# Patient Record
Sex: Female | Born: 1950 | Race: White | Hispanic: No | Smoking: Current every day smoker
Health system: Southern US, Community
[De-identification: ages and names within clinical notes are randomized; demographics above are authoritative.]

## PROBLEM LIST (undated history)

## (undated) DIAGNOSIS — K219 Gastro-esophageal reflux disease without esophagitis: Secondary | ICD-10-CM

## (undated) DIAGNOSIS — M109 Gout, unspecified: Secondary | ICD-10-CM

## (undated) DIAGNOSIS — F32A Depression, unspecified: Secondary | ICD-10-CM

## (undated) DIAGNOSIS — I10 Essential (primary) hypertension: Secondary | ICD-10-CM

## (undated) DIAGNOSIS — E785 Hyperlipidemia, unspecified: Secondary | ICD-10-CM

## (undated) DIAGNOSIS — J45909 Unspecified asthma, uncomplicated: Secondary | ICD-10-CM

## (undated) DIAGNOSIS — E669 Obesity, unspecified: Secondary | ICD-10-CM

## (undated) DIAGNOSIS — F329 Major depressive disorder, single episode, unspecified: Secondary | ICD-10-CM

## (undated) DIAGNOSIS — I509 Heart failure, unspecified: Secondary | ICD-10-CM

## (undated) DIAGNOSIS — J449 Chronic obstructive pulmonary disease, unspecified: Secondary | ICD-10-CM

## (undated) DIAGNOSIS — I719 Aortic aneurysm of unspecified site, without rupture: Secondary | ICD-10-CM

## (undated) DIAGNOSIS — R0789 Other chest pain: Secondary | ICD-10-CM

## (undated) DIAGNOSIS — F419 Anxiety disorder, unspecified: Secondary | ICD-10-CM

---

## 2010-11-07 ENCOUNTER — Emergency Department (HOSPITAL_COMMUNITY)
Admission: EM | Admit: 2010-11-07 | Discharge: 2010-11-07 | Payer: Self-pay | Source: Home / Self Care | Admitting: Emergency Medicine

## 2011-10-22 ENCOUNTER — Other Ambulatory Visit (HOSPITAL_COMMUNITY): Payer: Self-pay | Admitting: Internal Medicine

## 2011-10-22 DIAGNOSIS — R911 Solitary pulmonary nodule: Secondary | ICD-10-CM

## 2011-10-22 DIAGNOSIS — I2699 Other pulmonary embolism without acute cor pulmonale: Secondary | ICD-10-CM

## 2011-10-27 ENCOUNTER — Encounter (HOSPITAL_COMMUNITY)
Admission: RE | Admit: 2011-10-27 | Discharge: 2011-10-27 | Disposition: A | Discharge status: Home / Self Care | Payer: Self-pay | Source: Ambulatory Visit | Attending: Internal Medicine | Admitting: Internal Medicine

## 2011-10-27 DIAGNOSIS — I2510 Atherosclerotic heart disease of native coronary artery without angina pectoris: Secondary | ICD-10-CM | POA: Insufficient documentation

## 2011-10-27 DIAGNOSIS — R918 Other nonspecific abnormal finding of lung field: Secondary | ICD-10-CM | POA: Insufficient documentation

## 2011-10-27 DIAGNOSIS — I2699 Other pulmonary embolism without acute cor pulmonale: Secondary | ICD-10-CM

## 2011-10-27 DIAGNOSIS — R911 Solitary pulmonary nodule: Secondary | ICD-10-CM

## 2011-10-27 DIAGNOSIS — I700 Atherosclerosis of aorta: Secondary | ICD-10-CM | POA: Insufficient documentation

## 2011-10-27 DIAGNOSIS — K439 Ventral hernia without obstruction or gangrene: Secondary | ICD-10-CM | POA: Insufficient documentation

## 2011-10-27 DIAGNOSIS — D3500 Benign neoplasm of unspecified adrenal gland: Secondary | ICD-10-CM | POA: Insufficient documentation

## 2011-10-27 LAB — GLUCOSE, CAPILLARY: Glucose-Capillary: 120 mg/dL — ABNORMAL HIGH (ref 70–99)

## 2011-10-27 MED ORDER — FLUDEOXYGLUCOSE F - 18 (FDG) INJECTION
19.0000 | Freq: Once | INTRAVENOUS | Status: AC | PRN
  Administered 2011-10-27: 19 via INTRAVENOUS

## 2012-03-08 DIAGNOSIS — R079 Chest pain, unspecified: Secondary | ICD-10-CM

## 2012-03-12 ENCOUNTER — Emergency Department (HOSPITAL_COMMUNITY)
Admission: EM | Admit: 2012-03-12 | Discharge: 2012-03-12 | Disposition: A | Discharge status: Home / Self Care | Payer: Medicare Other | Attending: Emergency Medicine | Admitting: Emergency Medicine

## 2012-03-12 ENCOUNTER — Emergency Department (HOSPITAL_COMMUNITY): Payer: Medicare Other

## 2012-03-12 ENCOUNTER — Encounter (HOSPITAL_COMMUNITY): Payer: Self-pay

## 2012-03-12 ENCOUNTER — Encounter (HOSPITAL_COMMUNITY): Payer: Self-pay | Admitting: *Deleted

## 2012-03-12 ENCOUNTER — Emergency Department (INDEPENDENT_AMBULATORY_CARE_PROVIDER_SITE_OTHER)
Admission: EM | Admit: 2012-03-12 | Discharge: 2012-03-12 | Disposition: A | Discharge status: Other Acute Inpatient Hospital | Payer: Medicare Other | Source: Home / Self Care | Attending: Family Medicine | Admitting: Family Medicine

## 2012-03-12 DIAGNOSIS — I509 Heart failure, unspecified: Secondary | ICD-10-CM | POA: Insufficient documentation

## 2012-03-12 DIAGNOSIS — K219 Gastro-esophageal reflux disease without esophagitis: Secondary | ICD-10-CM | POA: Insufficient documentation

## 2012-03-12 DIAGNOSIS — I10 Essential (primary) hypertension: Secondary | ICD-10-CM | POA: Insufficient documentation

## 2012-03-12 DIAGNOSIS — R079 Chest pain, unspecified: Secondary | ICD-10-CM

## 2012-03-12 DIAGNOSIS — J449 Chronic obstructive pulmonary disease, unspecified: Secondary | ICD-10-CM | POA: Insufficient documentation

## 2012-03-12 DIAGNOSIS — I77819 Aortic ectasia, unspecified site: Secondary | ICD-10-CM | POA: Insufficient documentation

## 2012-03-12 DIAGNOSIS — J4489 Other specified chronic obstructive pulmonary disease: Secondary | ICD-10-CM | POA: Insufficient documentation

## 2012-03-12 DIAGNOSIS — F17200 Nicotine dependence, unspecified, uncomplicated: Secondary | ICD-10-CM | POA: Insufficient documentation

## 2012-03-12 HISTORY — DX: Heart failure, unspecified: I50.9

## 2012-03-12 HISTORY — DX: Chronic obstructive pulmonary disease, unspecified: J44.9

## 2012-03-12 HISTORY — DX: Aortic aneurysm of unspecified site, without rupture: I71.9

## 2012-03-12 HISTORY — DX: Gastro-esophageal reflux disease without esophagitis: K21.9

## 2012-03-12 HISTORY — DX: Essential (primary) hypertension: I10

## 2012-03-12 LAB — COMPREHENSIVE METABOLIC PANEL
Alkaline Phosphatase: 52 U/L (ref 39–117)
BUN: 12 mg/dL (ref 6–23)
CO2: 26 mEq/L (ref 19–32)
Chloride: 100 mEq/L (ref 96–112)
GFR calc Af Amer: 81 mL/min — ABNORMAL LOW (ref >90)
GFR calc non Af Amer: 70 mL/min — ABNORMAL LOW (ref >90)
Glucose, Bld: 112 mg/dL — ABNORMAL HIGH (ref 70–99)
Potassium: 3.2 mEq/L — ABNORMAL LOW (ref 3.5–5.1)
Total Bilirubin: 0.2 mg/dL — ABNORMAL LOW (ref 0.3–1.2)

## 2012-03-12 LAB — POCT I-STAT, CHEM 8
BUN: 13 mg/dL (ref 6–23)
Creatinine, Ser: 1 mg/dL (ref 0.50–1.10)
Glucose, Bld: 104 mg/dL — ABNORMAL HIGH (ref 70–99)
Potassium: 3.2 mEq/L — ABNORMAL LOW (ref 3.5–5.1)
Sodium: 139 mEq/L (ref 135–145)

## 2012-03-12 LAB — DIFFERENTIAL
Eosinophils Absolute: 0.2 10*3/uL (ref 0.0–0.7)
Lymphs Abs: 4.7 10*3/uL — ABNORMAL HIGH (ref 0.7–4.0)
Monocytes Absolute: 0.9 10*3/uL (ref 0.1–1.0)
Monocytes Relative: 7 % (ref 3–12)
Neutro Abs: 7.6 10*3/uL (ref 1.7–7.7)
Neutrophils Relative %: 57 % (ref 43–77)

## 2012-03-12 LAB — CBC
HCT: 42.4 % (ref 36.0–46.0)
HCT: 40.2 % (ref 36.0–46.0)
Hemoglobin: 15.1 g/dL — ABNORMAL HIGH (ref 12.0–15.0)
Hemoglobin: 14.2 g/dL (ref 12.0–15.0)
MCH: 30 pg (ref 26.0–34.0)
MCHC: 35.6 g/dL (ref 30.0–36.0)
MCV: 83.9 fL (ref 78.0–100.0)
RBC: 5.04 MIL/uL (ref 3.87–5.11)
WBC: 12 10*3/uL — ABNORMAL HIGH (ref 4.0–10.5)

## 2012-03-12 LAB — POCT I-STAT TROPONIN I

## 2012-03-12 LAB — LIPASE, BLOOD: Lipase: 49 U/L (ref 11–59)

## 2012-03-12 MED ORDER — SODIUM CHLORIDE 0.9 % IV BOLUS (SEPSIS)
1000.0000 mL | Freq: Once | INTRAVENOUS | Status: AC
  Administered 2012-03-12: 1000 mL via INTRAVENOUS

## 2012-03-12 MED ORDER — ONDANSETRON HCL 4 MG/2ML IJ SOLN
4.0000 mg | Freq: Once | INTRAMUSCULAR | Status: AC
  Administered 2012-03-12: 4 mg via INTRAVENOUS
  Filled 2012-03-12: qty 2

## 2012-03-12 MED ORDER — HYDROMORPHONE HCL PF 1 MG/ML IJ SOLN
1.0000 mg | Freq: Once | INTRAMUSCULAR | Status: AC
  Administered 2012-03-12: 1 mg via INTRAVENOUS
  Filled 2012-03-12: qty 1

## 2012-03-12 NOTE — ED Provider Notes (Signed)
History     CSN: [id number]  Arrival date & time 03/12/12  1744   First MD Initiated Contact with Patient 03/12/12 1751      Chief Complaint  Patient presents with  . Chest Pain    (Consider location/radiation/quality/duration/timing/severity/associated sxs/prior treatment) HPI Comments: 61 year old smoker female with history of COPD, hypertension and CHF. Recent hospital admission for chest pain discharge 2 days ago from [facility] in [place], [place] . Has not filled recent prescriptions including Levaquin. Diagnosed with a small thoraxic aneurysm as per patient report. Here complaining of persistent retrosternal burning sensation with no radiation, unchanged during the last week. Denies fever or chills, no SOB, no N/V/D. No abdominal pain. Has persistent cough with yellow sputum. No pleuritic type of pain. Denies wheezing.    Past Medical History  Diagnosis Date  . Aortic aneurysm     History reviewed. No pertinent past surgical history.  History reviewed. No pertinent family history.  History  Substance Use Topics  . Smoking status: Current Everyday Smoker  . Smokeless tobacco: Not on file  . Alcohol Use: No    OB History    Grav Para Term Preterm Abortions TAB SAB Ect Mult Living                  Review of Systems  Constitutional: Negative for fever, chills and diaphoresis.  Respiratory: Positive for cough. Negative for shortness of breath and wheezing.   Cardiovascular: Positive for chest pain and palpitations.  Neurological: Negative for dizziness and headaches.    Allergies  Penicillins  Home Medications   Current Outpatient Rx  Name Route Sig Dispense Refill  . ALBUTEROL SULFATE HFA 108 (90 BASE) MCG/ACT IN AERS Inhalation Inhale 2 puffs into the lungs every 6 (six) hours as needed.    . ALPRAZOLAM 0.5 MG PO TABS Oral Take 0.5 mg by mouth 3 (three) times daily as needed.    Marland Kitchen BENAZEPRIL-HYDROCHLOROTHIAZIDE 20-25 MG PO TABS Oral Take 1 tablet by mouth  daily.    Marland Kitchen CARVEDILOL 3.125 MG PO TABS Oral Take 3.125 mg by mouth 2 (two) times daily with a meal.    . FLUTICASONE-SALMETEROL 250-50 MCG/DOSE IN AEPB Inhalation Inhale 1 puff into the lungs every 12 (twelve) hours.    . FUROSEMIDE 40 MG PO TABS Oral Take 40 mg by mouth daily.    Marland Kitchen PANTOPRAZOLE SODIUM 40 MG PO TBEC Oral Take 40 mg by mouth daily.      BP 125/88  Pulse 79  Temp(Src) 98.2 F (36.8 C) (Oral)  Resp 10  SpO2 97%  Physical Exam  Nursing note and vitals reviewed. Constitutional: She is oriented to person, place, and time. She appears well-developed and well-nourished. No distress.       Perseverating about chest pain. Speaks in loud and full sentences.   HENT:  Mouth/Throat: Oropharynx is clear and moist.  Neck: No JVD present.  Cardiovascular: Normal rate, regular rhythm and normal heart sounds.        Trace bilateral LEE  Pulmonary/Chest:       Scattered rhonchi bilaterally. Impress rales in right lower base. No tachypnea or orthopnea. No active wheezing.   Abdominal: Soft. There is no tenderness.  Neurological: She is alert and oriented to person, place, and time.  Skin: No rash noted. She is not diaphoretic.    ED Course  Procedures (including critical care time)  Labs Reviewed - No data to display No results found.   1. Chest pain  MDM  61 year old smoker female with history of COPD, hypertension, CHF and thoraxic aneurism. Recent hospital admission for chest pain discharge 2 days ago from [facility] in [place], [place] . Here with persistent chest pain. On exam afebrile, non diaphoretic, stable vital signs, no JVD, impress right lower base crackles, and scattered rhonchi bilaterally, trace LEE. Not in respiratory distress.  EKG normal sinus rhythm ventricular rate 78, with no acute ischemic changes. Decided to transfer via shuttle to the emergency department for further evaluation and management.          Sharin Grave, MD 03/13/12  [phone]

## 2012-03-12 NOTE — ED Notes (Signed)
C/o CP (burning), heart beating out of her skin, palpitations/pounding. Sx ongoing for ~ 1 week. Comes and goes. Here from [facility], recently at [facility]. (Denies: nvd, fever, cough, sob, congestion, dizziness or other sx), alert, NAD, calm interactive, skin W&D, resps e/u.

## 2012-03-12 NOTE — ED Notes (Signed)
No rx given, pt voiced understanding to f/u with [place] and PCP.

## 2012-03-12 NOTE — ED Notes (Signed)
EKG given to DR. Jeraldine Loots

## 2012-03-12 NOTE — Discharge Instructions (Signed)
Your evaluation here tonight was largely reassuring, with no demonstration of acute changes in your medical condition.  It is extremely important that you followup with your primary care physician, as well as the cardiologists to whom you have been directly.  It is equally important that you stop smoking and obtain all prescribed medication as directed.   If you develop any new, or concerning changes in your condition please return to the emergency department immediately

## 2012-03-12 NOTE — ED Provider Notes (Signed)
History     CSN: [id number]  Arrival date & time 03/12/12  1857   First MD Initiated Contact with Patient 03/12/12 2106      Chief Complaint  Patient presents with  . Thoracic Aortic Aneurysm     sent from our urgent care   HPI The patient presents with concerns of ongoing chest pain.  She notes that she has had pain for approximately 2 weeks.  The pain is diffuse, tight.  She notes associated dyspnea, though no pleuritic or exertional changes in her pain.  The patient notes significant prostration and having been discharged from another facility 2 days ago following to consecutive presentations therefore this complaint.  Notably, the patient was told on discharge she has an aortic dissection, but she states that she was not made fully aware of what this means.  The patient was also provided prescriptions for medications, which she has not filled thus far. She notes that since discharge 2 days ago she continues to have this sense of discomfort I with no new characteristics, no new syncope, no new vomiting, diarrhea, fevers, chills.  She has a baseline mild cough, which is unchanged. Past Medical History  Diagnosis Date  . Aortic aneurysm   . COPD (chronic obstructive pulmonary disease)   . Hypertension   . CHF (congestive heart failure)   . Acid reflux     History reviewed. No pertinent past surgical history.  No family history on file.  History  Substance Use Topics  . Smoking status: Current Everyday Smoker  . Smokeless tobacco: Not on file  . Alcohol Use: No    OB History    Grav Para Term Preterm Abortions TAB SAB Ect Mult Living                  Review of Systems  Constitutional:       HPI  HENT:       HPI otherwise negative  Eyes: Negative.   Respiratory:       HPI, otherwise negative  Cardiovascular:       HPI, otherwise nmegative  Gastrointestinal: Negative for vomiting.  Genitourinary:       HPI, otherwise negative  Musculoskeletal:       HPI,  otherwise negative  Skin: Negative.   Neurological: Negative for syncope.    Allergies  Penicillins  Home Medications   Current Outpatient Rx  Name Route Sig Dispense Refill  . ALBUTEROL SULFATE HFA 108 (90 BASE) MCG/ACT IN AERS Inhalation Inhale 2 puffs into the lungs every 6 (six) hours as needed.    Marland Kitchen BENAZEPRIL-HYDROCHLOROTHIAZIDE 20-25 MG PO TABS Oral Take 1 tablet by mouth daily.    Marland Kitchen FLUTICASONE-SALMETEROL 250-50 MCG/DOSE IN AEPB Inhalation Inhale 1 puff into the lungs every 12 (twelve) hours.    . FUROSEMIDE 40 MG PO TABS Oral Take 40 mg by mouth daily.    . IPRATROPIUM BROMIDE 0.02 % IN SOLN Nebulization Take 500 mcg by nebulization 4 (four) times daily.    Marland Kitchen PANTOPRAZOLE SODIUM 40 MG PO TBEC Oral Take 40 mg by mouth daily.    Marland Kitchen TIOTROPIUM BROMIDE MONOHYDRATE 18 MCG IN CAPS Inhalation Place 18 mcg into inhaler and inhale daily.      BP 136/84  Pulse 75  Temp(Src) 98.2 F (36.8 C) (Oral)  Resp 16  SpO2 96%  Physical Exam  Nursing note and vitals reviewed. Constitutional: She is oriented to person, place, and time. She appears well-developed and well-nourished. No distress.  HENT:  Head: Normocephalic and atraumatic.  Eyes: Conjunctivae and EOM are normal.  Cardiovascular: Normal rate and regular rhythm.   Pulmonary/Chest: Effort normal. No stridor. No respiratory distress. She has decreased breath sounds. She has no wheezes. She has no rhonchi. She has no rales.  Abdominal: She exhibits no distension.  Musculoskeletal: She exhibits no edema.  Neurological: She is alert and oriented to person, place, and time. No cranial nerve deficit.  Skin: Skin is warm and dry.  Psychiatric: She has a normal mood and affect.    ED Course  Procedures (including critical care time)  Labs Reviewed  CBC - Abnormal; Notable for the following:    WBC 13.4 (*)    Hemoglobin 15.1 (*)    All other components within normal limits  DIFFERENTIAL - Abnormal; Notable for the following:     Lymphs Abs 4.7 (*)    All other components within normal limits  PRO B NATRIURETIC PEPTIDE - Abnormal; Notable for the following:    Pro B Natriuretic peptide (BNP) 271.6 (*)    All other components within normal limits  POCT I-STAT, CHEM 8 - Abnormal; Notable for the following:    Potassium 3.2 (*)    Glucose, Bld 104 (*)    Hemoglobin 15.6 (*)    All other components within normal limits  CBC - Abnormal; Notable for the following:    WBC 12.0 (*)    All other components within normal limits  COMPREHENSIVE METABOLIC PANEL - Abnormal; Notable for the following:    Potassium 3.2 (*)    Glucose, Bld 112 (*)    ALT 64 (*)    Total Bilirubin 0.2 (*)    GFR calc non Af Amer 70 (*)    GFR calc Af Amer 81 (*)    All other components within normal limits  POCT I-STAT TROPONIN I  LIPASE, BLOOD  TROPONIN I   Dg Chest 2 View  03/12/2012  *RADIOLOGY REPORT*  Clinical Data: Aortic aneurysm, tachycardia  CHEST - 2 VIEW  Comparison: 03/07/2012 radiograph, 03/05/2012 CT.  Findings: Cardiomediastinal contours are prominent however unchanged in this patient with known tortuous thoracic aorta with mild aneurysmal dilatation of the ascending component.  Prominent epicardial fat obscures the inferior margins of the heart. Allowing for this, no focal areas of consolidation identified.  No pleural effusion or pneumothorax.  Increased AP diameter may reflect COPD. The patient's known right lower lobe nodule is poorly visualized by radiographic technique.  Sequelae of prior lateral left rib fractures.  No acute osseous finding identified.  IMPRESSION: Cardiomediastinal contours are unchanged.  No radiographic evidence for acute cardiopulmonary process.  Original Report Authenticated By: Waneta Martins, M.D.     No diagnosis found.  Cardiac 75 sinus rhythm normal Pulse oximetry 97% room air normal  Date: 03/12/2012  Rate: 67  Rhythm: normal sinus rhythm  QRS Axis: normal  Intervals: normal  ST/T  Wave abnormalities: normal  Conduction Disutrbances: none  Narrative Interpretation: unremarkable    11:01 PM Following the patient's initial evaluation I obtained records from the patient's other hospital.  Those records are notable for the description of a CAT scan done 7 days ago that demonstrated a stable 4.7 cm dilation of the descending aorta, but no dissection.  The patient's discharge diagnoses from those hospitalizations for atypical chest pain, chronic bronchitis, hypertension, dyslipidemia, obesity, anxiety, depression, history of gout. A review of the patient's radiographic studies demonstrates no significant changes from prior studies.  MDM  This elderly appearing  smoker now presents with ongoing chest discomfort 2 days after being discharged following an evaluation that included CT angiography, stress test for this complaint.  On my exam the patient is in no distress, with unremarkable vital signs.  The patient seems most upset about the lack of clear diagnoses or explanation for her pain.  A review of the charts from the other facility and today's labs is reassuring for the absence of acute ongoing cardiac ischemia.  Specifically the patient's evaluation at the other facility included stress test and CTA.  These results were reassuring.  I discussed all findings with the patient, stressed the need to stop smoking and to obtain medications as prescribed.  She was discharged in stable condition to follow up with her physician.       Gerhard Munch, MD 03/12/12 [phone]

## 2012-03-12 NOTE — ED Notes (Signed)
Pt dx with thoracic aortic aneurysm at [facility] and was discharged recently.  Tonight, c/o "burning in chest" which she has had x 1 week. Denies n/v/d, SOB.

## 2012-03-12 NOTE — ED Notes (Addendum)
Patient was released 2 days ago from [facility] in [place] , [place], w diagnosis of thoracic aneurysm. Was given follow up appt and list of medications to have filled. States she has not filled any of her medications, as she does not have the funds to do so; c/o she is still having the same discomfort she was having when she went to the hospital, and wants to be check out; NAD at present C/o her chest is burning and her heart is beating out of her body

## 2012-05-12 ENCOUNTER — Encounter (HOSPITAL_COMMUNITY): Payer: Self-pay | Admitting: Emergency Medicine

## 2012-05-12 ENCOUNTER — Emergency Department (INDEPENDENT_AMBULATORY_CARE_PROVIDER_SITE_OTHER): Payer: Medicaid Other

## 2012-05-12 ENCOUNTER — Emergency Department (INDEPENDENT_AMBULATORY_CARE_PROVIDER_SITE_OTHER)
Admission: EM | Admit: 2012-05-12 | Discharge: 2012-05-12 | Disposition: A | Discharge status: Home / Self Care | Payer: Medicare Other | Source: Home / Self Care | Attending: Emergency Medicine | Admitting: Emergency Medicine

## 2012-05-12 DIAGNOSIS — J449 Chronic obstructive pulmonary disease, unspecified: Secondary | ICD-10-CM

## 2012-05-12 DIAGNOSIS — J4489 Other specified chronic obstructive pulmonary disease: Secondary | ICD-10-CM

## 2012-05-12 HISTORY — DX: Other chest pain: R07.89

## 2012-05-12 HISTORY — DX: Gout, unspecified: M10.9

## 2012-05-12 HISTORY — DX: Depression, unspecified: F32.A

## 2012-05-12 HISTORY — DX: Obesity, unspecified: E66.9

## 2012-05-12 HISTORY — DX: Major depressive disorder, single episode, unspecified: F32.9

## 2012-05-12 HISTORY — DX: Anxiety disorder, unspecified: F41.9

## 2012-05-12 HISTORY — DX: Unspecified asthma, uncomplicated: J45.909

## 2012-05-12 HISTORY — DX: Hyperlipidemia, unspecified: E78.5

## 2012-05-12 MED ORDER — GUAIFENESIN-CODEINE 100-10 MG/5ML PO SYRP: 5.0000 mL | ORAL_SOLUTION | Freq: Four times a day (QID) | ORAL | Status: AC | PRN

## 2012-05-12 MED ORDER — DEXAMETHASONE 4 MG PO TABS
16.0000 mg | ORAL_TABLET | Freq: Once | ORAL | Status: AC
  Administered 2012-05-12: 16 mg via ORAL

## 2012-05-12 MED ORDER — DEXAMETHASONE 4 MG PO TABS: ORAL_TABLET | ORAL | Status: AC

## 2012-05-12 MED ORDER — DEXAMETHASONE 4 MG PO TABS
ORAL_TABLET | ORAL | Status: AC
  Filled 2012-05-12: qty 4

## 2012-05-12 MED ORDER — ALBUTEROL SULFATE (5 MG/ML) 0.5% IN NEBU
INHALATION_SOLUTION | RESPIRATORY_TRACT | Status: AC
  Filled 2012-05-12: qty 1

## 2012-05-12 MED ORDER — ALBUTEROL SULFATE (5 MG/ML) 0.5% IN NEBU
5.0000 mg | INHALATION_SOLUTION | Freq: Once | RESPIRATORY_TRACT | Status: AC
  Administered 2012-05-12: 5 mg via RESPIRATORY_TRACT

## 2012-05-12 MED ORDER — IPRATROPIUM BROMIDE 0.02 % IN SOLN
0.5000 mg | Freq: Once | RESPIRATORY_TRACT | Status: AC
  Administered 2012-05-12: 0.5 mg via RESPIRATORY_TRACT

## 2012-05-12 MED ORDER — FLUTICASONE-SALMETEROL 250-50 MCG/DOSE IN AEPB: 1.0000 | INHALATION_SPRAY | Freq: Two times a day (BID) | RESPIRATORY_TRACT | Status: DC

## 2012-05-12 MED ORDER — AZITHROMYCIN 250 MG PO TABS: 250.0000 mg | ORAL_TABLET | Freq: Every day | ORAL | Status: AC

## 2012-05-12 NOTE — ED Provider Notes (Signed)
History     CSN: [id number]  Arrival date & time 05/12/12  1411   First MD Initiated Contact with Patient 05/12/12 1433      Chief Complaint  Patient presents with  . Bronchitis    (Consider location/radiation/quality/duration/timing/severity/associated sxs/prior treatment) HPI Comments: Patient is a long-term smoker with COPD/chronic bronchitis who presents with 4 days of increasing coughing which is productive of whitish sputum mixed with some thick yellowish sputum, wheezing, chest tightness, shortness of breath. No nausea, vomiting, fevers. No change in the color of her sputum. She was seen in the [place] ED 3 days ago, and she states that the labs and x-rays were negative for any acute infectious process. She was sent home with oral prednisone, which she did not fill. She is currently using her albuterol inhaler every 4 hours with temporary relief in her symptoms. She states she ran out of her Advair "months ago". She is not on any other inhaled steroids. No recent steroid use, admissions, intubations.  Patient is a 61 y.o. female presenting with cough. The history is provided by the patient. No language interpreter was used.  Cough This is a chronic problem. The current episode started more than 2 days ago. The problem occurs constantly. The problem has been gradually worsening. The cough is productive of sputum. There has been no fever. Associated symptoms include shortness of breath and wheezing. Pertinent negatives include no chest pain, no chills, no rhinorrhea, no sore throat and no myalgias. Treatments tried: albuterol q 4 hr. The treatment provided mild relief. She is a smoker. Her past medical history is significant for bronchitis, COPD and asthma. Her past medical history does not include pneumonia.    Past Medical History  Diagnosis Date  . Aortic aneurysm   . COPD (chronic obstructive pulmonary disease)   . Hypertension   . CHF (congestive heart failure)   . Acid reflux    . Chest pain, atypical     neg CTA and stress test 2013  . Obesity   . Asthma     chronic bronchitis  . Dyslipidemia   . Anxiety   . Depression   . Gout     History reviewed. No pertinent past surgical history.  History reviewed. No pertinent family history.  History  Substance Use Topics  . Smoking status: Current Everyday Smoker  . Smokeless tobacco: Not on file  . Alcohol Use: No    OB History    Grav Para Term Preterm Abortions TAB SAB Ect Mult Living                  Review of Systems  Constitutional: Negative for chills.  HENT: Negative for sore throat and rhinorrhea.   Respiratory: Positive for cough, shortness of breath and wheezing.   Cardiovascular: Negative for chest pain.  Musculoskeletal: Negative for myalgias.    Allergies  Penicillins  Home Medications   Current Outpatient Rx  Name Route Sig Dispense Refill  . ALBUTEROL SULFATE HFA 108 (90 BASE) MCG/ACT IN AERS Inhalation Inhale 2 puffs into the lungs every 6 (six) hours as needed.    . AZITHROMYCIN 250 MG PO TABS Oral Take 1 tablet (250 mg total) by mouth daily. 2 tabs po on day one, then one tablet po once daily on days 2-5. 6 tablet 0  . BENAZEPRIL-HYDROCHLOROTHIAZIDE 20-25 MG PO TABS Oral Take 1 tablet by mouth daily.    Marland Kitchen DEXAMETHASONE 4 MG PO TABS  4 tablets (16 mg) po at once on  day 1 and 4 tabs (16 mg) po at once on day 2 8 tablet 0  . FLUTICASONE-SALMETEROL 250-50 MCG/DOSE IN AEPB Inhalation Inhale 1 puff into the lungs every 12 (twelve) hours. 60 each 0  . FUROSEMIDE 40 MG PO TABS Oral Take 40 mg by mouth daily.    . GUAIFENESIN-CODEINE 100-10 MG/5ML PO SYRP Oral Take 5 mLs by mouth 4 (four) times daily as needed for cough or congestion. 120 mL 0  . PANTOPRAZOLE SODIUM 40 MG PO TBEC Oral Take 40 mg by mouth daily.      BP 144/87  Pulse 87  Temp 97.7 F (36.5 C) (Oral)  Resp 18  SpO2 96%  Physical Exam  Nursing note and vitals reviewed. Constitutional: She is oriented to person,  place, and time. She appears well-developed and well-nourished. No distress.  HENT:  Head: Normocephalic and atraumatic.  Eyes: Conjunctivae and EOM are normal.  Neck: Normal range of motion.  Cardiovascular: Normal rate, regular rhythm and normal heart sounds.   Pulmonary/Chest: Effort normal. No respiratory distress. She has wheezes. She has no rales. She exhibits no tenderness.  Abdominal: She exhibits no distension.  Musculoskeletal: Normal range of motion. She exhibits no edema.  Neurological: She is alert and oriented to person, place, and time. Coordination normal.  Skin: Skin is warm and dry.  Psychiatric: She has a normal mood and affect. Her behavior is normal. Judgment and thought content normal.    ED Course  Procedures (including critical care time)  Labs Reviewed - No data to display Dg Chest 2 View  05/12/2012  *RADIOLOGY REPORT*  Clinical Data: Chronic bronchitis, cough  CHEST - 2 VIEW  Comparison: 05/08/2012  Findings: Stable old fracture deformity of the left seventh rib. Lungs are hyperinflated, clear.  Heart size within normal limits. No effusion.  IMPRESSION:  Hyperinflation without acute or superimposed abnormality.  Original Report Authenticated By: Thora Lance III, M.D.     1. Chronic bronchitis with COPD (chronic obstructive pulmonary disease)     MDM  Previous records reviewed. Additional medical history obtained. Last seen in the [facility] on 03/12/2012 for ongoing chest pain.   Pt seen and examined. Pt speaking in full sentences but has wheezing throughout all lung fields, occ ronchi. Pt given albuterol/atrovent, dexamethasone for COPD exacerbation. Will send home on azithromycin as she is a smoker.  Will re-evaluate.   On re-evaluation, pt feels much improved after medication, pt comfortable, VSS. Continued wheezing, but Improved air movement on repeat physical exam.  Reviewed imaging independently and discussed them with patient.Advised to quit smoking  Emphasized importance of f/u. Pt  agrees.      Luiz Blare, MD 05/12/12 2146

## 2012-05-12 NOTE — ED Notes (Signed)
Breathing treatment completed.  Reports no significant change; "I am so used to these".

## 2012-05-12 NOTE — ED Notes (Signed)
PT HERE WITH C/O COUGH WITH YELLOW MUCOUS,AND CHEST TIGHTNESS UNRELIEVED BY BREATHING TREATMENTS AT HOME.HX COPD,BRONCHITIS,ASTHMA.STATES SHE WENT TO ER LAST WEEK AND WORKUP CXR,BLOODWORK NEG.SATS 96% R/A

## 2012-10-28 IMAGING — CR DG CHEST 2V
2 series · 2 of 2 positions shown · non-contrast
Comparison: 05/08/2012

CLINICAL DATA: Chronic bronchitis, cough

CHEST - 2 VIEW

[view not recorded (1 of 2)]
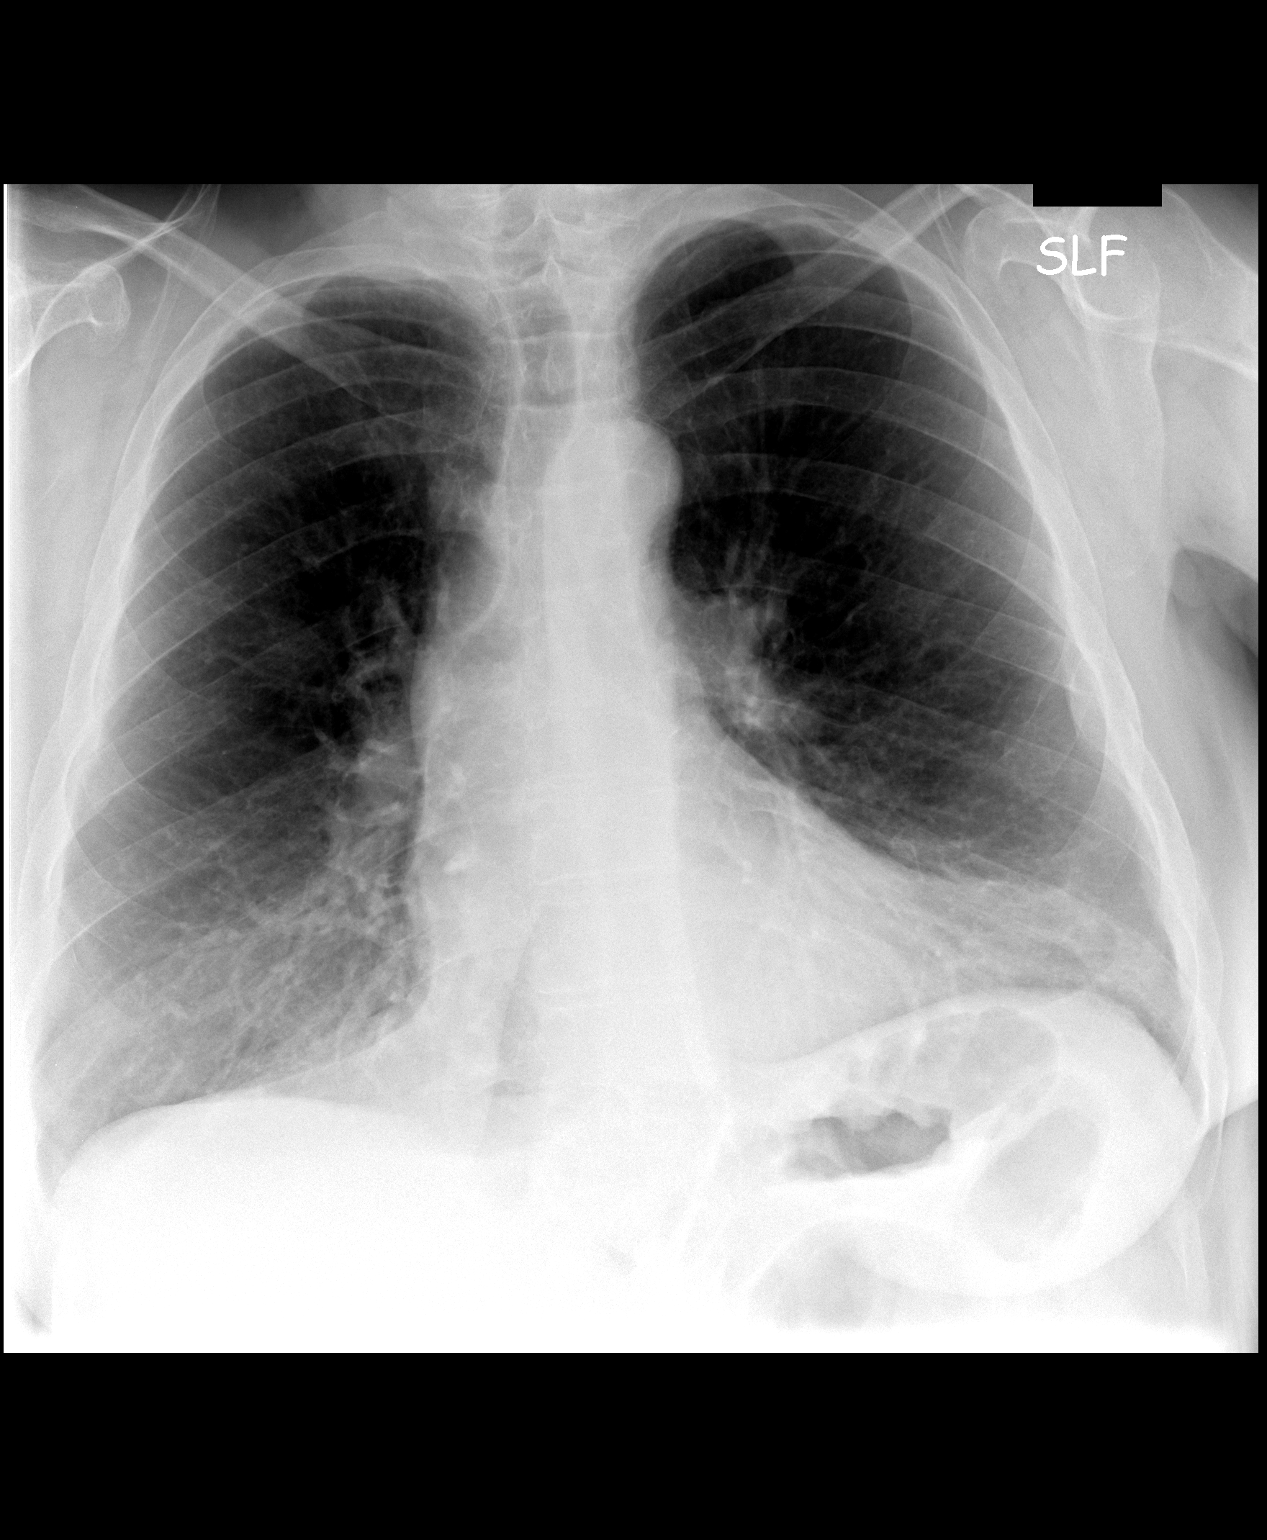

[view not recorded (2 of 2)]
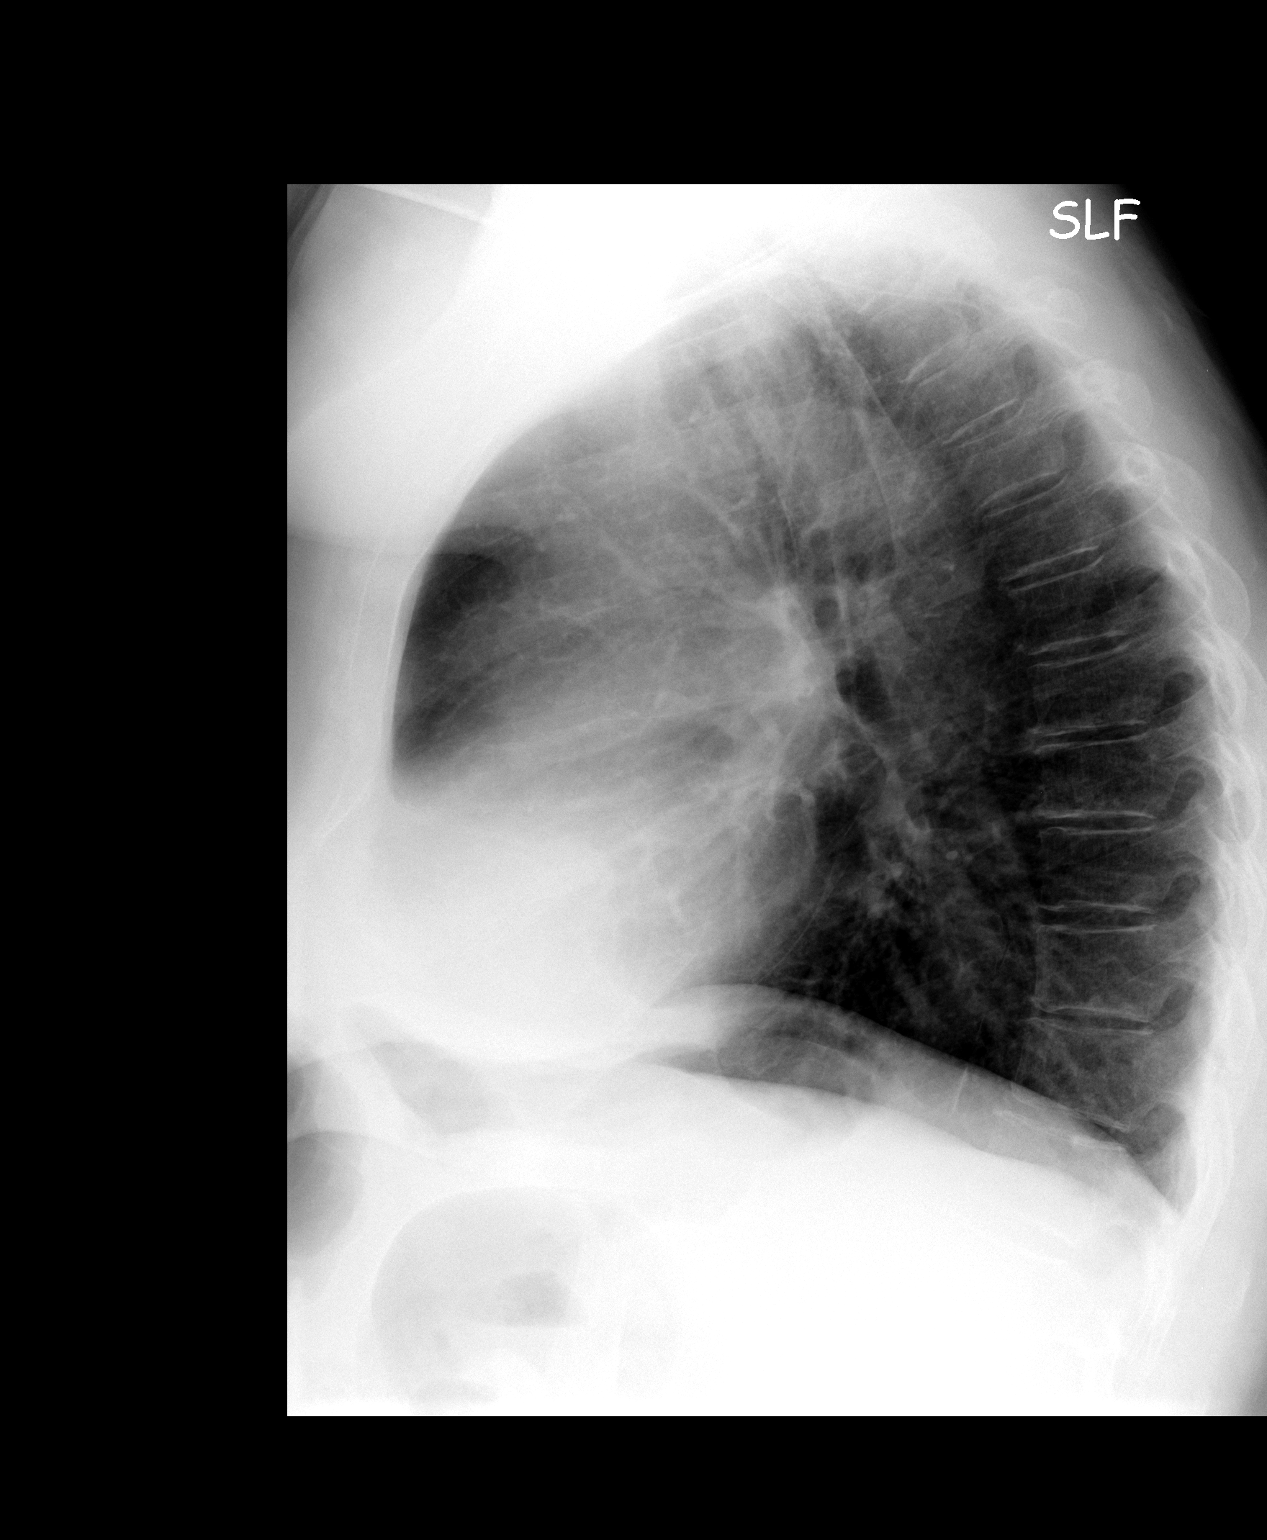

[2 of 2 positions shown; findings below may reference images not displayed]

FINDINGS: Stable old fracture deformity of the left seventh rib.
Lungs are hyperinflated, clear.  Heart size within normal limits.
No effusion.
IMPRESSION: Hyperinflation without acute or superimposed abnormality.

## 2016-12-03 ENCOUNTER — Encounter (INDEPENDENT_AMBULATORY_CARE_PROVIDER_SITE_OTHER): Payer: Self-pay

## 2016-12-03 ENCOUNTER — Telehealth: Payer: Self-pay | Admitting: Pediatrics

## 2016-12-03 ENCOUNTER — Encounter: Payer: Self-pay | Admitting: Pediatrics

## 2016-12-03 ENCOUNTER — Ambulatory Visit (INDEPENDENT_AMBULATORY_CARE_PROVIDER_SITE_OTHER): Payer: Medicare Other | Admitting: Pediatrics

## 2016-12-03 VITALS — BP 126/81 | HR 74 | Temp 97.4°F | Ht 65.0 in | Wt 210.0 lb

## 2016-12-03 DIAGNOSIS — E559 Vitamin D deficiency, unspecified: Secondary | ICD-10-CM | POA: Diagnosis not present

## 2016-12-03 DIAGNOSIS — I10 Essential (primary) hypertension: Secondary | ICD-10-CM | POA: Diagnosis not present

## 2016-12-03 DIAGNOSIS — J449 Chronic obstructive pulmonary disease, unspecified: Secondary | ICD-10-CM | POA: Insufficient documentation

## 2016-12-03 DIAGNOSIS — E119 Type 2 diabetes mellitus without complications: Secondary | ICD-10-CM

## 2016-12-03 DIAGNOSIS — K219 Gastro-esophageal reflux disease without esophagitis: Secondary | ICD-10-CM | POA: Diagnosis not present

## 2016-12-03 DIAGNOSIS — F329 Major depressive disorder, single episode, unspecified: Secondary | ICD-10-CM | POA: Insufficient documentation

## 2016-12-03 DIAGNOSIS — Z720 Tobacco use: Secondary | ICD-10-CM | POA: Insufficient documentation

## 2016-12-03 DIAGNOSIS — F32A Depression, unspecified: Secondary | ICD-10-CM | POA: Insufficient documentation

## 2016-12-03 DIAGNOSIS — E785 Hyperlipidemia, unspecified: Secondary | ICD-10-CM

## 2016-12-03 LAB — BAYER DCA HB A1C WAIVED: HB A1C (BAYER DCA - WAIVED): 7 % — ABNORMAL HIGH (ref <7.0)

## 2016-12-03 MED ORDER — BUDESONIDE-FORMOTEROL FUMARATE 80-4.5 MCG/ACT IN AERO: 2.0000 | INHALATION_SPRAY | Freq: Two times a day (BID) | RESPIRATORY_TRACT | 3 refills | Status: DC

## 2016-12-03 NOTE — Progress Notes (Signed)
Subjective:   Patient ID: Theresa Osborne, female    DOB: Sep 09, 1951, 66 y.o.   MRN: [id number] CC: Establish Care follow up med problems  HPI: Theresa Osborne is a 66 y.o. female presenting for Establish Care  COPD: wheezing and coughing more the last couple of days Coughing up white phelgm, not any more than usual Occasional SOB if walking long distances Using duonebs about 3 times a day Taking flovent inhaler every day  DM2: takes meds regualrly  HTN: no chest pain  Had stomach hernia repair 2-3 yrs ago Was put on pantoprazole several years ago Helps some, still with symptoms  Smoking: down to 3-4 cig a day from 4 ppd  Past Medical History:  Diagnosis Date  . Acid reflux   . Anxiety   . Aortic aneurysm ([place])   . Asthma    chronic bronchitis  . Chest pain, atypical    neg CTA and stress test 2013  . CHF (congestive heart failure) ([place])   . COPD (chronic obstructive pulmonary disease) ([place])   . Depression   . Dyslipidemia   . Gout   . Hypertension   . Obesity    No family history on file. Social History   Social History  . Marital status: Widowed    Spouse name: N/A  . Number of children: N/A  . Years of education: N/A   Social History Main Topics  . Smoking status: Current Every Day Smoker  . Smokeless tobacco: Never Used  . Alcohol use No  . Drug use: No  . Sexual activity: No   Other Topics Concern  . None   Social History Narrative  . None   ROS: All systems negative other than what is in HPI  Objective:    BP 126/81   Pulse 74   Temp 97.4 F (36.3 C) (Oral)   Ht '5\' 5"'  (1.651 m)   Wt 210 lb (95.3 kg)   BMI 34.95 kg/m   Wt Readings from Last 3 Encounters:  12/03/16 210 lb (95.3 kg)    Gen: NAD, alert, cooperative with exam, NCAT EYES: EOMI, no conjunctival injection, or no icterus CV: NRRR, normal S1/S2, no murmur, distal pulses 2+ b/l Resp: scattered exp wheeze b/l, normal WOB. Moving air well Abd: +BS, soft, NTND. no guarding or  organomegaly Ext: No edema, warm Neuro: Alert and oriented, strength equal b/l UE and LE, coordination grossly normal MSK: normal muscle bulk  Assessment & Plan:  Theresa Osborne was seen today for establish care.  Diagnoses and all orders for this visit:  Diabetes mellitus without complication ([place]) E7O 7 today Cont metformin -     Bayer DCA Hb A1c Waived -     CMP14+EGFR -     Lipid panel  Chronic obstructive pulmonary disease, unspecified COPD type ([place]) Stop flovent Start symbicort Cont to work on tobacco cessation duonebs TID while sick No worsening in cough, sputum production -     budesonide-formoterol (SYMBICORT) 80-4.5 MCG/ACT inhaler; Inhale 2 puffs into the lungs 2 (two) times daily.  Hyperlipidemia, unspecified hyperlipidemia type Check lipid panel today, cont statin  Gastroesophageal reflux disease, esophagitis presence not specified Still with symptoms regularly, worse with certain foods Discussed foods to avoid Cont pantoprazole Reassess symptoms next visit  Depression, unspecified depression type Takes celexa daily, symptoms well controlled per pt, cont  Essential hypertension Adequate contorl today, cont current meds  Vitamin D deficiency -     VITAMIN D 25 Hydroxy (Vit-D Deficiency, Fractures)  Follow up plan: Return in about 4 weeks (around 12/31/2016) for Complete physical exam. [name], MD [place]

## 2016-12-04 LAB — CMP14+EGFR
A/G RATIO: 1.7 (ref 1.2–2.2)
ALT: 10 IU/L (ref 0–32)
AST: 8 IU/L (ref 0–40)
Albumin: 4.4 g/dL (ref 3.6–4.8)
Alkaline Phosphatase: 72 IU/L (ref 39–117)
BUN / CREAT RATIO: 14 (ref 12–28)
BUN: 16 mg/dL (ref 8–27)
Bilirubin Total: 0.3 mg/dL (ref 0.0–1.2)
CO2: 27 mmol/L (ref 18–29)
Calcium: 9.6 mg/dL (ref 8.7–10.3)
Chloride: 94 mmol/L — ABNORMAL LOW (ref 96–106)
Creatinine, Ser: 1.11 mg/dL — ABNORMAL HIGH (ref 0.57–1.00)
GFR, EST AFRICAN AMERICAN: 61 mL/min/{1.73_m2} (ref >59)
GFR, EST NON AFRICAN AMERICAN: 53 mL/min/{1.73_m2} — AB (ref >59)
GLOBULIN, TOTAL: 2.6 g/dL (ref 1.5–4.5)
Glucose: 100 mg/dL — ABNORMAL HIGH (ref 65–99)
POTASSIUM: 4.6 mmol/L (ref 3.5–5.2)
Sodium: 139 mmol/L (ref 134–144)
TOTAL PROTEIN: 7 g/dL (ref 6.0–8.5)

## 2016-12-04 LAB — LIPID PANEL
CHOL/HDL RATIO: 2.7 ratio (ref 0.0–4.4)
Cholesterol, Total: 137 mg/dL (ref 100–199)
HDL: 51 mg/dL (ref >39)
LDL Calculated: 65 mg/dL (ref 0–99)
Triglycerides: 105 mg/dL (ref 0–149)
VLDL Cholesterol Cal: 21 mg/dL (ref 5–40)

## 2016-12-04 LAB — VITAMIN D 25 HYDROXY (VIT D DEFICIENCY, FRACTURES): VIT D 25 HYDROXY: 32.2 ng/mL (ref 30.0–100.0)

## 2016-12-17 NOTE — Telephone Encounter (Signed)
lmtcb jkp 2/21

## 2016-12-30 NOTE — Telephone Encounter (Signed)
Patient has an appt to be seen tomorrow 12/31/2016

## 2016-12-31 ENCOUNTER — Encounter: Payer: Medicare Other | Admitting: Pediatrics

## 2017-01-08 ENCOUNTER — Encounter: Payer: Medicare Other | Admitting: Pediatrics

## 2017-01-28 ENCOUNTER — Encounter: Payer: Self-pay | Admitting: Pediatrics

## 2017-01-28 ENCOUNTER — Ambulatory Visit (INDEPENDENT_AMBULATORY_CARE_PROVIDER_SITE_OTHER): Payer: Medicare Other | Admitting: Pediatrics

## 2017-01-28 VITALS — BP 136/86 | HR 86 | Temp 97.7°F | Ht 65.0 in | Wt 207.0 lb

## 2017-01-28 DIAGNOSIS — Z23 Encounter for immunization: Secondary | ICD-10-CM

## 2017-01-28 DIAGNOSIS — Z1211 Encounter for screening for malignant neoplasm of colon: Secondary | ICD-10-CM

## 2017-01-28 DIAGNOSIS — E119 Type 2 diabetes mellitus without complications: Secondary | ICD-10-CM

## 2017-01-28 DIAGNOSIS — J449 Chronic obstructive pulmonary disease, unspecified: Secondary | ICD-10-CM | POA: Diagnosis not present

## 2017-01-28 DIAGNOSIS — G4700 Insomnia, unspecified: Secondary | ICD-10-CM

## 2017-01-28 DIAGNOSIS — Z720 Tobacco use: Secondary | ICD-10-CM

## 2017-01-28 NOTE — Patient Instructions (Signed)
Melatonin 3-9mg --take 30 minutes before you go to bed

## 2017-01-28 NOTE — Progress Notes (Signed)
  Subjective:   Patient ID: Theresa Osborne, female    DOB: Oct 29, 1950, 66 y.o.   MRN: [id number] CC: f/u med problems and Trouble sleeping  HPI: Theresa Osborne is a 66 y.o. female presenting for Annual Exam and Trouble sleeping  Breathing has been ok Uses ipratropium/albuterol 3-4 times a day with nebulizer Uses symbicort every day Sometimes SOB with walking, no worse than usual baseline  Declines mammogram, colonoscopy, pap smear, says she does not believe in them  HTN: Checks at home, 110s/70s, 116/78 No CP Occasional HA  GERD: takes protonix every day Has symptoms when she misses doses  Depression: mood usually ok on citalopram Taking at night Has a hard time staying asleep Sometimes feels sleepy Rarely takes naps during the day Feels well rested when she wakes up in the morning No thoughts of self harm  Tobacco use: smokes daily Not interested in quitting  Relevant past medical, surgical, family and social history reviewed. Allergies and medications reviewed and updated. History  Smoking Status  . Current Every Day Smoker  Smokeless Tobacco  . Never Used   ROS: Per HPI   Objective:    BP 136/86   Pulse 86   Temp 97.7 F (36.5 C) (Oral)   Ht 5\' 5"  (1.651 m)   Wt 207 lb (93.9 kg)   BMI 34.45 kg/m   Wt Readings from Last 3 Encounters:  01/28/17 207 lb (93.9 kg)  12/03/16 210 lb (95.3 kg)    Gen: NAD, alert, cooperative with exam, NCAT EYES: EOMI, no conjunctival injection, or no icterus ENT:  OP without erythema LYMPH: no cervical LAD CV: NRRR, normal S1/S2, no murmur, distal pulses 2+ b/l Resp: CTABL, slight scattered wheeze with forced exhalation, normal WOB Abd: +BS, soft, NTND. no guarding or organomegaly Ext: No edema, warm Neuro: Alert and oriented, strength equal b/l UE and LE, coordination grossly normal, normal sensation feet b/l with touch and monofilament MSK: normal muscle bulk  Assessment & Plan:  [name] was seen today for follow up  and trouble sleeping.  Diagnoses and all orders for this visit:  Diabetes mellitus without complication (HCC) Adequate control Cont medicine, sugar avoidance A1c 7.0 last visit Foot exam done today Eye exam she has planned near [facility] in [place] this month -     Microalbumin / creatinine urine ratio  Screen for colon cancer Declines colonoscopy, open to FIT test, knows colonoscopy would be rec if positive -     Fecal occult blood, imunochemical; Future  Need for vaccination against Streptococcus pneumoniae using pneumococcal conjugate vaccine 7 Given today  Chronic obstructive pulmonary disease, unspecified COPD type ([place]) Stable, cont meds  Tobacco use Encouraged cessation, pt said not interested at this time  Other orders -     Pneumococcal polysaccharide vaccine 23-valent greater than or equal to 2yo subcutaneous/IM  Insomnia Discussed sleep hygiene Melatonin trial  Follow up plan: 3 mo, sooner prn [name], MD [place]

## 2017-01-29 DIAGNOSIS — G4700 Insomnia, unspecified: Secondary | ICD-10-CM | POA: Insufficient documentation

## 2017-01-29 LAB — MICROALBUMIN / CREATININE URINE RATIO
Creatinine, Urine: 29.6 mg/dL
Microalbumin, Urine: <3 ug/mL

## 2017-03-02 ENCOUNTER — Encounter: Payer: Self-pay | Admitting: Pediatrics

## 2017-03-05 ENCOUNTER — Ambulatory Visit: Payer: Medicare Other | Admitting: Pediatrics

## 2017-03-09 ENCOUNTER — Telehealth: Payer: Self-pay | Admitting: Pediatrics

## 2017-03-09 ENCOUNTER — Encounter: Payer: Self-pay | Admitting: Pediatrics

## 2017-03-23 ENCOUNTER — Other Ambulatory Visit: Payer: Self-pay | Admitting: Pediatrics

## 2017-03-23 DIAGNOSIS — J449 Chronic obstructive pulmonary disease, unspecified: Secondary | ICD-10-CM

## 2017-03-31 ENCOUNTER — Encounter: Payer: Self-pay | Admitting: Pediatrics

## 2017-04-10 ENCOUNTER — Ambulatory Visit: Payer: Medicare Other | Admitting: Pediatrics

## 2017-04-13 ENCOUNTER — Ambulatory Visit (INDEPENDENT_AMBULATORY_CARE_PROVIDER_SITE_OTHER): Payer: Medicare Other | Admitting: Pediatrics

## 2017-04-13 ENCOUNTER — Encounter: Payer: Self-pay | Admitting: Pediatrics

## 2017-04-13 VITALS — BP 132/89 | HR 81 | Temp 97.1°F | Ht 65.0 in | Wt 203.8 lb

## 2017-04-13 DIAGNOSIS — R590 Localized enlarged lymph nodes: Secondary | ICD-10-CM | POA: Diagnosis not present

## 2017-04-13 DIAGNOSIS — F329 Major depressive disorder, single episode, unspecified: Secondary | ICD-10-CM | POA: Diagnosis not present

## 2017-04-13 DIAGNOSIS — Z720 Tobacco use: Secondary | ICD-10-CM

## 2017-04-13 DIAGNOSIS — I10 Essential (primary) hypertension: Secondary | ICD-10-CM

## 2017-04-13 DIAGNOSIS — Z1159 Encounter for screening for other viral diseases: Secondary | ICD-10-CM | POA: Diagnosis not present

## 2017-04-13 DIAGNOSIS — F32A Depression, unspecified: Secondary | ICD-10-CM

## 2017-04-13 DIAGNOSIS — E119 Type 2 diabetes mellitus without complications: Secondary | ICD-10-CM

## 2017-04-13 LAB — BAYER DCA HB A1C WAIVED: HB A1C (BAYER DCA - WAIVED): 6.5 % (ref <7.0)

## 2017-04-13 NOTE — Progress Notes (Signed)
  Subjective:   Patient ID: Theresa Osborne, female    DOB: 19-Nov-1950, 66 y.o.   MRN: [id number] CC: Follow-up multiple med problems HPI: Theresa Osborne is a 66 y.o. female presenting for Follow-up  DM2: doesn't check BGLs at home Taking metformin regularly  Sister died about two weeks ago Has had decreased appetite Eats about one meal a day, usually supper Not feeling hungry Says she doesn't care about food as much Feels safe at home, no thoughts of self harm Takes celexa daily, has been helping with mood usually   Told she had a mass on R lung that she needed a biopsy of several years ago She did not follow up because she didn't want a bronchoscopy  CT scan in 2014 with mediastinal LAD suggestive of malignant process per chart review  No fevers No interest in stopping tobacco use at this time  Relevant past medical, surgical, family and social history reviewed. Allergies and medications reviewed and updated. History  Smoking Status  . Current Every Day Smoker  Smokeless Tobacco  . Never Used   ROS: Per HPI   Objective:    BP 132/89   Pulse 81   Temp 97.1 F (36.2 C) (Oral)   Ht 5\' 5"  (1.651 m)   Wt 203 lb 12.8 oz (92.4 kg)   BMI 33.91 kg/m   Wt Readings from Last 3 Encounters:  04/13/17 203 lb 12.8 oz (92.4 kg)  01/28/17 207 lb (93.9 kg)  12/03/16 210 lb (95.3 kg)    Gen: NAD, alert, cooperative with exam, NCAT EYES: EOMI, no conjunctival injection, or no icterus ENT:  OP without erythema LYMPH: no cervical LAD, no supraclavicular LAD CV: NRRR, normal S1/S2, no murmur, distal pulses 2+ b/l Resp: coarse breath sounds throughout, normal WOB Abd: +BS, soft, NTND. Ext: No edema, warm Neuro: Alert and oriented  Assessment & Plan:  Theresa Osborne was seen today for follow-up multiple med problems.  Diagnoses and all orders for this visit:  Diabetes mellitus without complication (HCC) K4Q 6.5 Cont meds -     Bayer DCA Hb A1c Waived  Lymphadenopathy,  mediastinal -     CT Chest Wo Contrast; Future  Need for hepatitis C screening test -     Hepatitis C antibody  Essential hypertension Slightly elevated today Cont losartan Check at home  Tobacco use Cont to encourage cessation  Depression, unspecified depression type Mood down since loss of sister recently, feeling safe at home Was well controlled prior to acute loss If any worsening of symptoms pt says she is comfortable rtc  Follow up plan: Return in about 3 months (around 07/14/2017). [name], MD [place]

## 2017-04-14 LAB — HEPATITIS C ANTIBODY

## 2017-04-20 ENCOUNTER — Ambulatory Visit (HOSPITAL_COMMUNITY): Payer: Medicare Other

## 2017-04-27 ENCOUNTER — Other Ambulatory Visit: Payer: Self-pay | Admitting: *Deleted

## 2017-04-27 MED ORDER — PANTOPRAZOLE SODIUM 40 MG PO TBEC: 40.0000 mg | DELAYED_RELEASE_TABLET | Freq: Every day | ORAL | 5 refills | Status: DC

## 2017-06-04 ENCOUNTER — Other Ambulatory Visit: Payer: Self-pay | Admitting: Pediatrics

## 2017-06-05 NOTE — Telephone Encounter (Signed)
Last seen 04/13/17  Dr Evette Doffing    I do not see Potassium or amlodipine on list of meds

## 2017-06-22 ENCOUNTER — Ambulatory Visit (INDEPENDENT_AMBULATORY_CARE_PROVIDER_SITE_OTHER): Payer: Medicare Other | Admitting: Pediatrics

## 2017-06-22 ENCOUNTER — Encounter: Payer: Self-pay | Admitting: Pediatrics

## 2017-06-22 VITALS — BP 137/87 | HR 83 | Temp 96.9°F | Ht 65.0 in | Wt 205.2 lb

## 2017-06-22 DIAGNOSIS — R1011 Right upper quadrant pain: Secondary | ICD-10-CM

## 2017-06-22 DIAGNOSIS — R1013 Epigastric pain: Secondary | ICD-10-CM

## 2017-06-22 MED ORDER — PANTOPRAZOLE SODIUM 40 MG PO TBEC: 40.0000 mg | DELAYED_RELEASE_TABLET | Freq: Every day | ORAL | 5 refills | Status: DC

## 2017-06-22 NOTE — Progress Notes (Signed)
  Subjective:   Patient ID: Theresa Osborne, female    DOB: Aug 24, 1951, 66 y.o.   MRN: [id number] CC: Abdominal Pain (Upper) and Nausea  HPI: Theresa Osborne is a 66 y.o. female presenting for Abdominal Pain (Upper) and Nausea  Here today with brother in law  Has had about 3 weeks of upper abd pain protonix helping some, but still with pain  Some foods seem to make it worse She isnt sure which ones Mostly eating the [employer] meal plates Not much greasy foods she says  stooling regularly, no comstpiation, no diarrhea No vomiting Nausea worst in the morning, can get worst   No fevers   Relevant past medical, surgical, family and social history reviewed. Allergies and medications reviewed and updated. History  Smoking Status  . Current Every Day Smoker  Smokeless Tobacco  . Never Used   ROS: Per HPI   Objective:    BP 137/87   Pulse 83   Temp (!) 96.9 F (36.1 C) (Oral)   Ht _0  (1.651 m)   Wt 205 lb 3.2 oz (93.1 kg)   BMI 34.15 kg/m   Wt Readings from Last 3 Encounters:  06/22/17 205 lb 3.2 oz (93.1 kg)  04/13/17 203 lb 12.8 oz (92.4 kg)  01/28/17 207 lb (93.9 kg)    Gen: NAD, alert, cooperative with exam, NCAT EYES: EOMI, no conjunctival injection, or no icterus CV: NRRR, normal S1/S2, no murmur, distal pulses 2+ b/l Resp: CTABL, no wheezes, normal WOB Abd: +BS, soft, NT, mildly distended, no guarding, neg murphys sign Ext: No edema, warm Neuro: Alert and oriented, strength equal b/l UE and LE, coordination grossly normal MSK: normal muscle bulk  Assessment & Plan:  Theresa Osborne was seen today for abdominal pain and nausea.  Diagnoses and all orders for this visit:  Epigastric pain Cont protonix Avoid greasy and acidic foods Check labs, [place] Discussed return precautions -     US Abdomen Complete; Future -     CMP14+EGFR -     Lipase -     CBC with Differential/Platelet -     TSH -     Urinalysis, Complete -     pantoprazole (PROTONIX) 40 MG tablet;  Take 1 tablet (40 mg total) by mouth daily.  RUQ pain -     US Abdomen Complete; Future -     CMP14+EGFR -     Lipase -     CBC with Differential/Platelet -     TSH -     Urinalysis, Complete -     pantoprazole (PROTONIX) 40 MG tablet; Take 1 tablet (40 mg total) by mouth daily.   Follow up plan: Return in about 3 weeks (around 07/13/2017). [name], MD [place]

## 2017-06-22 NOTE — Addendum Note (Signed)
Addended by: Wardell Heath on: 06/22/2017 05:25 PM   Modules accepted: Orders

## 2017-06-23 ENCOUNTER — Other Ambulatory Visit: Payer: Self-pay | Admitting: Pediatrics

## 2017-06-23 DIAGNOSIS — J449 Chronic obstructive pulmonary disease, unspecified: Secondary | ICD-10-CM

## 2017-06-23 LAB — CBC WITH DIFFERENTIAL/PLATELET
BASOS: 0 %
Basophils Absolute: 0 10*3/uL (ref 0.0–0.2)
EOS (ABSOLUTE): 0.2 10*3/uL (ref 0.0–0.4)
EOS: 2 %
HEMATOCRIT: 42.9 % (ref 34.0–46.6)
HEMOGLOBIN: 13.8 g/dL (ref 11.1–15.9)
IMMATURE GRANS (ABS): 0 10*3/uL (ref 0.0–0.1)
IMMATURE GRANULOCYTES: 0 %
LYMPHS: 31 %
Lymphocytes Absolute: 3.4 10*3/uL — ABNORMAL HIGH (ref 0.7–3.1)
MCH: 27.4 pg (ref 26.6–33.0)
MCHC: 32.2 g/dL (ref 31.5–35.7)
MCV: 85 fL (ref 79–97)
Monocytes Absolute: 0.6 10*3/uL (ref 0.1–0.9)
Monocytes: 5 %
NEUTROS ABS: 6.9 10*3/uL (ref 1.4–7.0)
Neutrophils: 62 %
PLATELETS: 250 10*3/uL (ref 150–379)
RBC: 5.03 x10E6/uL (ref 3.77–5.28)
RDW: 14.7 % (ref 12.3–15.4)
WBC: 11.1 10*3/uL — ABNORMAL HIGH (ref 3.4–10.8)

## 2017-06-23 LAB — CMP14+EGFR
A/G RATIO: 1.5 (ref 1.2–2.2)
ALBUMIN: 4.1 g/dL (ref 3.6–4.8)
ALT: 10 IU/L (ref 0–32)
AST: 16 IU/L (ref 0–40)
Alkaline Phosphatase: 82 IU/L (ref 39–117)
BUN / CREAT RATIO: 13 (ref 12–28)
BUN: 13 mg/dL (ref 8–27)
Bilirubin Total: <0.2 mg/dL (ref 0.0–1.2)
CALCIUM: 9.4 mg/dL (ref 8.7–10.3)
CO2: 27 mmol/L (ref 20–29)
Chloride: 92 mmol/L — ABNORMAL LOW (ref 96–106)
Creatinine, Ser: 1.03 mg/dL — ABNORMAL HIGH (ref 0.57–1.00)
GFR, EST AFRICAN AMERICAN: 66 mL/min/{1.73_m2} (ref >59)
GFR, EST NON AFRICAN AMERICAN: 58 mL/min/{1.73_m2} — AB (ref >59)
Globulin, Total: 2.8 g/dL (ref 1.5–4.5)
Glucose: 110 mg/dL — ABNORMAL HIGH (ref 65–99)
Potassium: 3.7 mmol/L (ref 3.5–5.2)
Sodium: 140 mmol/L (ref 134–144)
TOTAL PROTEIN: 6.9 g/dL (ref 6.0–8.5)

## 2017-06-23 LAB — LIPASE: Lipase: 85 U/L — ABNORMAL HIGH (ref 14–72)

## 2017-06-23 LAB — TSH: TSH: 6.46 u[IU]/mL — ABNORMAL HIGH (ref 0.450–4.500)

## 2017-06-24 LAB — MICROSCOPIC EXAMINATION: Casts: NONE SEEN /lpf

## 2017-06-24 LAB — URINALYSIS, COMPLETE
BILIRUBIN UA: NEGATIVE
Glucose, UA: NEGATIVE
KETONES UA: NEGATIVE
LEUKOCYTES UA: NEGATIVE
NITRITE UA: NEGATIVE
Protein, UA: NEGATIVE
RBC UA: NEGATIVE
Specific Gravity, UA: 1.018 (ref 1.005–1.030)
UUROB: 0.2 mg/dL (ref 0.2–1.0)
pH, UA: 5.5 (ref 5.0–7.5)

## 2017-07-02 ENCOUNTER — Other Ambulatory Visit: Payer: Self-pay | Admitting: Pediatrics

## 2017-07-02 DIAGNOSIS — E039 Hypothyroidism, unspecified: Secondary | ICD-10-CM

## 2017-07-03 ENCOUNTER — Ambulatory Visit (HOSPITAL_COMMUNITY): Payer: Medicare Other

## 2017-07-09 ENCOUNTER — Ambulatory Visit (HOSPITAL_COMMUNITY): Admission: RE | Admit: 2017-07-09 | Payer: Medicare Other | Source: Ambulatory Visit

## 2017-07-13 ENCOUNTER — Encounter: Payer: Self-pay | Admitting: Pediatrics

## 2017-07-13 ENCOUNTER — Ambulatory Visit (INDEPENDENT_AMBULATORY_CARE_PROVIDER_SITE_OTHER): Payer: Medicare Other | Admitting: Pediatrics

## 2017-07-13 VITALS — BP 123/76 | HR 72 | Temp 97.0°F | Ht 65.0 in | Wt 206.0 lb

## 2017-07-13 DIAGNOSIS — I712 Thoracic aortic aneurysm, without rupture, unspecified: Secondary | ICD-10-CM

## 2017-07-13 DIAGNOSIS — J449 Chronic obstructive pulmonary disease, unspecified: Secondary | ICD-10-CM

## 2017-07-13 DIAGNOSIS — H269 Unspecified cataract: Secondary | ICD-10-CM

## 2017-07-13 DIAGNOSIS — R918 Other nonspecific abnormal finding of lung field: Secondary | ICD-10-CM | POA: Insufficient documentation

## 2017-07-13 DIAGNOSIS — E039 Hypothyroidism, unspecified: Secondary | ICD-10-CM

## 2017-07-13 MED ORDER — BUDESONIDE-FORMOTEROL FUMARATE 160-4.5 MCG/ACT IN AERO
2.0000 | INHALATION_SPRAY | Freq: Two times a day (BID) | RESPIRATORY_TRACT | 3 refills | Status: DC
Start: 2017-07-13 — End: 2018-06-07

## 2017-07-13 MED ORDER — IPRATROPIUM-ALBUTEROL 0.5-2.5 (3) MG/3ML IN SOLN: 3.0000 mL | Freq: Four times a day (QID) | RESPIRATORY_TRACT | 3 refills | Status: DC | PRN

## 2017-07-13 MED ORDER — ATORVASTATIN CALCIUM 80 MG PO TABS: 80.0000 mg | ORAL_TABLET | Freq: Every day | ORAL | 1 refills | Status: DC

## 2017-07-13 NOTE — Progress Notes (Signed)
Subjective:   Patient ID: Theresa Osborne, female    DOB: 1951-01-28, 66 y.o.   MRN: [id number] CC: Follow-up (3 week) abd pain HPI: Theresa Osborne is a 66 y.o. female presenting for Follow-up (3 week)  Seen three weeks ago for abd pain Had abd U/s ordered, pt says she canceled it because abd pain has improved  Appetite is good, has good days and days she doesn't eat as much because she doesn't want it No pain, no nausea, no early satiety No fevers  Elevated TSH: due for repeat  Had L cataract removed several years ago, needs R cataract surgery per pt  Did not get CT scan ordered last visit for hilar LAD seen on CT chest in 10/2012 Did have CT scan when seen in ED at [facility]  that month for SOB. Records for visit not yet available, but have been requested. CT scan read available through PACS. CT scan showed RUL lung mass and enlarging thoracic aortic aneurysm.    Relevant past medical, surgical, family and social history reviewed. Allergies and medications reviewed and updated. History  Smoking Status  . Current Every Day Smoker  Smokeless Tobacco  . Never Used   ROS: Per HPI   Objective:    BP 123/76   Pulse 72   Temp (!) 97 F (36.1 C) (Oral)   Ht _0  (1.651 m)   Wt 206 lb (93.4 kg)   BMI 34.28 kg/m   Wt Readings from Last 3 Encounters:  07/13/17 206 lb (93.4 kg)  06/22/17 205 lb 3.2 oz (93.1 kg)  04/13/17 203 lb 12.8 oz (92.4 kg)    Gen: NAD, alert, cooperative with exam, NCAT EYES: EOMI, no conjunctival injection, or no icterus ENT: OP without erythema LYMPH: no cervical LAD CV: NRRR, normal S1/S2, no murmur, distal pulses 2+ b/l Resp: CTABL, no wheezes, normal WOB Abd: +BS, soft, NTND. no guarding or organomegaly Ext: No edema, warm Neuro: Alert and oriented  Assessment & Plan:  Theresa Osborne was seen today for follow-up multiple med problems.  Diagnoses and all orders for this visit:  Cataract of right eye, unspecified cataract type -      Ambulatory referral to Ophthalmology  Chronic obstructive pulmonary disease, unspecified COPD type ([place]) No wheezing today Needing rescue nebulizer regularly, on low dose symbicort Increase symbicort  -     ipratropium-albuterol (DUONEB) 0.5-2.5 (3) MG/3ML SOLN; Take 3 mLs by nebulization every 6 (six) hours as needed. -     budesonide-formoterol (SYMBICORT) 160-4.5 MCG/ACT inhaler; Inhale 2 puffs into the lungs 2 (two) times daily. -     BMP8+EGFR -     Thyroid Panel With TSH  Thoracic aortic aneurysm without rupture ([place]) Enlarging per CTA read available through [facility] Records requested so can be scanned into system refer to CT surg -     atorvastatin (LIPITOR) 80 MG tablet; Take 1 tablet (80 mg total) by mouth daily at 6 PM. -     Ambulatory referral to Cardiothoracic Surgery -     BMP8+EGFR -     Thyroid Panel With TSH  Lung mass Seen on CT scan 03/2017 at OSH Records have been requested Discussed CT findings with pt and her brother-in-law Theresa Osborne over CT images showing mass, discussed need for biopsy, concern for cancer Questions answered, referred to pulm. Current smoker Not interested in cessation at this time -     Ambulatory referral to Pulmonology -     BMP8+EGFR -  Thyroid Panel With TSH  Follow up plan: 2 mo Theresa Found, MD [place]

## 2017-07-14 LAB — BMP8+EGFR
BUN/Creatinine Ratio: 17 (ref 12–28)
BUN: 16 mg/dL (ref 8–27)
CALCIUM: 8.5 mg/dL — AB (ref 8.7–10.3)
CO2: 23 mmol/L (ref 20–29)
CREATININE: 0.94 mg/dL (ref 0.57–1.00)
Chloride: 101 mmol/L (ref 96–106)
GFR calc Af Amer: 74 mL/min/{1.73_m2} (ref >59)
GFR, EST NON AFRICAN AMERICAN: 64 mL/min/{1.73_m2} (ref >59)
GLUCOSE: 95 mg/dL (ref 65–99)
POTASSIUM: 4.3 mmol/L (ref 3.5–5.2)
SODIUM: 140 mmol/L (ref 134–144)

## 2017-07-14 LAB — THYROID PANEL WITH TSH
Free Thyroxine Index: 1.8 (ref 1.2–4.9)
T3 UPTAKE RATIO: 21 % — AB (ref 24–39)
T4 TOTAL: 8.4 ug/dL (ref 4.5–12.0)
TSH: 7.09 u[IU]/mL — AB (ref 0.450–4.500)

## 2017-07-17 ENCOUNTER — Encounter: Payer: Self-pay | Admitting: *Deleted

## 2017-07-22 MED ORDER — LEVOTHYROXINE SODIUM 50 MCG PO TABS: 50.0000 ug | ORAL_TABLET | Freq: Every day | ORAL | 3 refills | Status: DC

## 2017-07-22 NOTE — Addendum Note (Signed)
Addended by: Eustaquio Maize on: 07/22/2017 02:28 PM   Modules accepted: Orders

## 2017-07-28 ENCOUNTER — Encounter: Payer: Medicare Other | Admitting: Thoracic Surgery (Cardiothoracic Vascular Surgery)

## 2017-07-30 ENCOUNTER — Telehealth: Payer: Self-pay | Admitting: Pediatrics

## 2017-08-03 ENCOUNTER — Telehealth: Payer: Self-pay | Admitting: Pediatrics

## 2017-08-03 NOTE — Telephone Encounter (Signed)
Called and left message, has she been able to schedule appts? Needs to get in to see pulm vs CT surg re lung mass that is likely malignancy for biopsy.

## 2017-09-03 NOTE — Telephone Encounter (Signed)
Multiple attempts made to contact patient.  Patient has appt next week 09/11/17.  This encounter will now be closed

## 2017-09-11 ENCOUNTER — Encounter (INDEPENDENT_AMBULATORY_CARE_PROVIDER_SITE_OTHER): Payer: Self-pay

## 2017-09-11 ENCOUNTER — Encounter: Payer: Self-pay | Admitting: Pediatrics

## 2017-09-11 ENCOUNTER — Ambulatory Visit (INDEPENDENT_AMBULATORY_CARE_PROVIDER_SITE_OTHER): Payer: Medicare Other | Admitting: Pediatrics

## 2017-09-11 VITALS — BP 131/83 | HR 76 | Temp 97.4°F | Ht 65.0 in | Wt 230.6 lb

## 2017-09-11 DIAGNOSIS — I712 Thoracic aortic aneurysm, without rupture, unspecified: Secondary | ICD-10-CM

## 2017-09-11 DIAGNOSIS — R918 Other nonspecific abnormal finding of lung field: Secondary | ICD-10-CM

## 2017-09-11 DIAGNOSIS — J449 Chronic obstructive pulmonary disease, unspecified: Secondary | ICD-10-CM | POA: Diagnosis not present

## 2017-09-11 NOTE — Patient Instructions (Addendum)
Theresa Osborne pulmonology:  Cardiothoracic surgery:   Theresa Osborne Appointment: [phone]

## 2017-09-11 NOTE — Progress Notes (Signed)
  Subjective:   Patient ID: Theresa Osborne, female    DOB: 1951-03-15, 66 y.o.   MRN: [id number] CC: Follow-up medical problems HPI: Theresa Osborne is a 66 y.o. female presenting for Follow-up  Here today with her brother in law Sister Rod Holler who lives in [place] also called in during clinic visit  Again discussed mass in her R upper lobe suspicious for malignancy Pt has not followed up with pulmonology or CT surgery after last visit discussions Says she is worried about the procedure, in particular doesn't want anyone going into her lung She has had two brothers one, with lung cancer, one with throat cancer Theresa Osborne as she was talking about CT results from last visit with her sister her sister told her not to get "cut on" Both pt and sister have bad associations with all the medical problems their brothers went through, including bronchoscopy  Pt feeling well now  No trouble breathing Not recently needing nebulizer Smoking daily now Not interested in quitting  In the past has declined mammograms, is open to mammogram today at sister's urging  HTN: no HA, no CP or SOB Taking med regularly  Relevant past medical, surgical, family and social history reviewed. Allergies and medications reviewed and updated. Social History   Tobacco Use  Smoking Status Current Every Day Smoker  Smokeless Tobacco Never Used   ROS: Per HPI   Objective:    BP 131/83   Pulse 76   Temp (!) 97.4 F (36.3 C) (Oral)   Ht 5\' 5"  (1.651 m)   Wt 230 lb 9.6 oz (104.6 kg)   BMI 38.37 kg/m   Wt Readings from Last 3 Encounters:  09/11/17 230 lb 9.6 oz (104.6 kg)  07/13/17 206 lb (93.4 kg)  06/22/17 205 lb 3.2 oz (93.1 kg)    Gen: NAD, alert, cooperative with exam, NCAT EYES: EOMI, no conjunctival injection, or no icterus ENT:  OP without erythema LYMPH: no cervical LAD CV: NRRR, normal S1/S2, no murmur, distal pulses 2+ b/l Resp: CTABL, no wheezes, normal WOB Abd: +BS, soft, NTND. no guarding or  organomegaly Ext: No edema, warm Neuro: Alert and oriented, strength equal b/l UE and LE, coordination grossly normal  Assessment & Plan:  Theresa Osborne was seen today for follow-up multiple med problems  Diagnoses and all orders for this visit:  Lung mass Discussed next steps at length, need for diagnosis and biopsy, benefit in finding cancers early if this is a cancer Pt in agreement with biopsy  Thoracic aortic aneurysm without rupture ([place]) Discussed CT results, need for further evaluation Pt agrees with plan to see specialist  Chronic obstructive pulmonary disease, unspecified COPD type ([place]) Stable, cont to encourage cessation of smoking  I spent 25 minutes with the patient with over 50% of the encounter time dedicated to counseling on the above problems.  Follow up plan: Return in about 4 weeks (around 10/09/2017). [name], MD [place]

## 2017-09-12 ENCOUNTER — Encounter: Payer: Self-pay | Admitting: Pediatrics

## 2017-09-15 ENCOUNTER — Ambulatory Visit: Payer: Medicare Other | Admitting: Pediatrics

## 2017-10-13 ENCOUNTER — Other Ambulatory Visit: Payer: Self-pay | Admitting: *Deleted

## 2017-10-13 DIAGNOSIS — I712 Thoracic aortic aneurysm, without rupture, unspecified: Secondary | ICD-10-CM

## 2017-10-13 MED ORDER — MONTELUKAST SODIUM 10 MG PO TABS: ORAL_TABLET | ORAL | 2 refills | Status: DC

## 2017-10-13 MED ORDER — FUROSEMIDE 40 MG PO TABS: ORAL_TABLET | ORAL | 2 refills | Status: DC

## 2017-10-13 MED ORDER — ATORVASTATIN CALCIUM 80 MG PO TABS: 80.0000 mg | ORAL_TABLET | Freq: Every day | ORAL | 0 refills | Status: DC

## 2017-11-09 ENCOUNTER — Telehealth: Payer: Self-pay | Admitting: *Deleted

## 2017-11-09 NOTE — Telephone Encounter (Signed)
Left message for patient to call back.  Patient needs to call and schedule appt with pulmonologist

## 2017-11-09 NOTE — Telephone Encounter (Signed)
-----   Message from Eustaquio Maize, MD sent at 11/07/2017  8:50 PM EST ----- Regarding: needs pulm appt Hi Valerya Maxton, can you call pt on Monday, it does not look like she has set up appt to see pulmonology yet for lung mass. I still really want her to see them. She has an appt with me Friday but I don't want to wait until then to move forward with scheduling appt if pt is OK with it.

## 2017-11-09 NOTE — Telephone Encounter (Signed)
-----   Message from Eustaquio Maize, MD sent at 11/07/2017  8:50 PM EST ----- Regarding: needs pulm appt Hi Almee Pelphrey, can you call pt on Monday, it does not look like she has set up appt to see pulmonology yet for lung mass. I still really want her to see them. She has an appt with me Friday but I don't want to wait until then to move forward with scheduling appt if pt is OK with it.

## 2017-11-09 NOTE — Telephone Encounter (Signed)
Patient aware to contact pulmonologist to schedule an appt

## 2017-11-13 ENCOUNTER — Ambulatory Visit: Payer: Medicare Other | Admitting: Pediatrics

## 2017-11-16 ENCOUNTER — Telehealth: Payer: Self-pay | Admitting: *Deleted

## 2017-11-16 ENCOUNTER — Other Ambulatory Visit: Payer: Self-pay | Admitting: Pediatrics

## 2017-11-16 ENCOUNTER — Ambulatory Visit: Payer: Medicare Other | Admitting: Pediatrics

## 2017-11-16 ENCOUNTER — Telehealth: Payer: Self-pay | Admitting: Pediatrics

## 2017-11-16 NOTE — Telephone Encounter (Signed)
Spoke with patient regarding referral to pulmonologist.  Patient states that she has not set up an appt yet.  Informed patient on how important it is to see pulmonologist.  Phone number given

## 2017-11-16 NOTE — Telephone Encounter (Signed)
Thank you. Will discuss at upcoming appt 1/31, rescheduled from today and last week.

## 2017-11-17 ENCOUNTER — Encounter: Payer: Self-pay | Admitting: Pediatrics

## 2017-11-26 ENCOUNTER — Ambulatory Visit: Payer: Medicare Other | Admitting: Pediatrics

## 2017-12-02 ENCOUNTER — Encounter: Payer: Self-pay | Admitting: Pediatrics

## 2017-12-05 ENCOUNTER — Other Ambulatory Visit: Payer: Self-pay | Admitting: Pediatrics

## 2017-12-05 DIAGNOSIS — J449 Chronic obstructive pulmonary disease, unspecified: Secondary | ICD-10-CM

## 2018-01-04 ENCOUNTER — Other Ambulatory Visit: Payer: Self-pay | Admitting: Pediatrics

## 2018-01-07 DIAGNOSIS — J441 Chronic obstructive pulmonary disease with (acute) exacerbation: Secondary | ICD-10-CM | POA: Diagnosis not present

## 2018-01-07 DIAGNOSIS — E119 Type 2 diabetes mellitus without complications: Secondary | ICD-10-CM | POA: Diagnosis not present

## 2018-01-07 DIAGNOSIS — K210 Gastro-esophageal reflux disease with esophagitis: Secondary | ICD-10-CM | POA: Diagnosis not present

## 2018-01-07 DIAGNOSIS — I10 Essential (primary) hypertension: Secondary | ICD-10-CM | POA: Diagnosis not present

## 2018-01-07 DIAGNOSIS — E038 Other specified hypothyroidism: Secondary | ICD-10-CM | POA: Diagnosis not present

## 2018-01-08 ENCOUNTER — Other Ambulatory Visit: Payer: Self-pay | Admitting: Pediatrics

## 2018-01-11 ENCOUNTER — Other Ambulatory Visit: Payer: Self-pay | Admitting: *Deleted

## 2018-01-11 DIAGNOSIS — I712 Thoracic aortic aneurysm, without rupture, unspecified: Secondary | ICD-10-CM

## 2018-01-12 MED ORDER — ATORVASTATIN CALCIUM 80 MG PO TABS: 80.0000 mg | ORAL_TABLET | Freq: Every day | ORAL | 0 refills | Status: DC

## 2018-01-28 ENCOUNTER — Other Ambulatory Visit: Payer: Self-pay | Admitting: Pediatrics

## 2018-02-10 ENCOUNTER — Other Ambulatory Visit: Payer: Self-pay | Admitting: Pediatrics

## 2018-02-10 DIAGNOSIS — J449 Chronic obstructive pulmonary disease, unspecified: Secondary | ICD-10-CM

## 2018-02-27 ENCOUNTER — Other Ambulatory Visit: Payer: Self-pay | Admitting: Pediatrics

## 2018-02-27 DIAGNOSIS — J449 Chronic obstructive pulmonary disease, unspecified: Secondary | ICD-10-CM

## 2018-03-28 ENCOUNTER — Other Ambulatory Visit: Payer: Self-pay | Admitting: Pediatrics

## 2018-03-28 ENCOUNTER — Other Ambulatory Visit: Payer: Self-pay | Admitting: Family Medicine

## 2018-03-28 DIAGNOSIS — R1013 Epigastric pain: Secondary | ICD-10-CM

## 2018-03-28 DIAGNOSIS — J449 Chronic obstructive pulmonary disease, unspecified: Secondary | ICD-10-CM

## 2018-03-28 DIAGNOSIS — R1011 Right upper quadrant pain: Secondary | ICD-10-CM

## 2018-04-05 ENCOUNTER — Other Ambulatory Visit: Payer: Self-pay | Admitting: Pediatrics

## 2018-04-05 DIAGNOSIS — I712 Thoracic aortic aneurysm, without rupture, unspecified: Secondary | ICD-10-CM

## 2018-04-06 ENCOUNTER — Other Ambulatory Visit: Payer: Self-pay | Admitting: Pediatrics

## 2018-04-06 DIAGNOSIS — J449 Chronic obstructive pulmonary disease, unspecified: Secondary | ICD-10-CM

## 2018-04-08 DIAGNOSIS — I10 Essential (primary) hypertension: Secondary | ICD-10-CM | POA: Diagnosis not present

## 2018-04-08 DIAGNOSIS — J441 Chronic obstructive pulmonary disease with (acute) exacerbation: Secondary | ICD-10-CM | POA: Diagnosis not present

## 2018-04-08 DIAGNOSIS — K210 Gastro-esophageal reflux disease with esophagitis: Secondary | ICD-10-CM | POA: Diagnosis not present

## 2018-04-08 DIAGNOSIS — E038 Other specified hypothyroidism: Secondary | ICD-10-CM | POA: Diagnosis not present

## 2018-04-25 ENCOUNTER — Other Ambulatory Visit: Payer: Self-pay | Admitting: Pediatrics

## 2018-04-25 DIAGNOSIS — R1011 Right upper quadrant pain: Secondary | ICD-10-CM

## 2018-04-25 DIAGNOSIS — J449 Chronic obstructive pulmonary disease, unspecified: Secondary | ICD-10-CM

## 2018-04-25 DIAGNOSIS — R1013 Epigastric pain: Secondary | ICD-10-CM

## 2018-05-27 DIAGNOSIS — R0602 Shortness of breath: Secondary | ICD-10-CM | POA: Diagnosis not present

## 2018-05-27 DIAGNOSIS — I509 Heart failure, unspecified: Secondary | ICD-10-CM | POA: Diagnosis not present

## 2018-05-27 DIAGNOSIS — J45909 Unspecified asthma, uncomplicated: Secondary | ICD-10-CM | POA: Diagnosis not present

## 2018-05-27 DIAGNOSIS — J9801 Acute bronchospasm: Secondary | ICD-10-CM | POA: Diagnosis not present

## 2018-05-27 DIAGNOSIS — R0902 Hypoxemia: Secondary | ICD-10-CM | POA: Diagnosis not present

## 2018-05-27 DIAGNOSIS — R0600 Dyspnea, unspecified: Secondary | ICD-10-CM | POA: Diagnosis not present

## 2018-05-27 DIAGNOSIS — R55 Syncope and collapse: Secondary | ICD-10-CM | POA: Diagnosis not present

## 2018-05-27 DIAGNOSIS — Z720 Tobacco use: Secondary | ICD-10-CM | POA: Diagnosis not present

## 2018-05-27 DIAGNOSIS — R42 Dizziness and giddiness: Secondary | ICD-10-CM | POA: Diagnosis not present

## 2018-05-27 DIAGNOSIS — Z79899 Other long term (current) drug therapy: Secondary | ICD-10-CM | POA: Diagnosis not present

## 2018-05-27 DIAGNOSIS — E119 Type 2 diabetes mellitus without complications: Secondary | ICD-10-CM | POA: Diagnosis not present

## 2018-05-27 DIAGNOSIS — R918 Other nonspecific abnormal finding of lung field: Secondary | ICD-10-CM | POA: Diagnosis not present

## 2018-05-27 DIAGNOSIS — R0689 Other abnormalities of breathing: Secondary | ICD-10-CM | POA: Diagnosis not present

## 2018-05-27 DIAGNOSIS — I110 Hypertensive heart disease with heart failure: Secondary | ICD-10-CM | POA: Diagnosis not present

## 2018-05-27 DIAGNOSIS — R1013 Epigastric pain: Secondary | ICD-10-CM | POA: Diagnosis not present

## 2018-06-05 ENCOUNTER — Other Ambulatory Visit: Payer: Self-pay | Admitting: Pediatrics

## 2018-06-05 DIAGNOSIS — J449 Chronic obstructive pulmonary disease, unspecified: Secondary | ICD-10-CM

## 2018-06-07 ENCOUNTER — Other Ambulatory Visit: Payer: Self-pay | Admitting: Pediatrics

## 2018-06-07 DIAGNOSIS — E039 Hypothyroidism, unspecified: Secondary | ICD-10-CM

## 2018-06-07 NOTE — Telephone Encounter (Signed)
Last thyroid 07/13/18  Requesting 90 day supply

## 2018-06-07 NOTE — Telephone Encounter (Signed)
Last seen 09/11/17

## 2018-06-07 NOTE — Telephone Encounter (Signed)
Sent in 30 days, needs to be seen for more refills. Can you call to schedule appt?

## 2018-06-21 ENCOUNTER — Other Ambulatory Visit: Payer: Self-pay | Admitting: Pediatrics

## 2018-06-21 NOTE — Telephone Encounter (Signed)
Last seen 09/11/17

## 2018-06-22 NOTE — Telephone Encounter (Signed)
I sent in refill, can you call pt, she needs appt to come in to be seen, won't be able to send in more refills without an appt bc she is due for blood work to make sure the medicine is not affecting her kidneys

## 2018-06-27 ENCOUNTER — Other Ambulatory Visit: Payer: Self-pay | Admitting: Pediatrics

## 2018-06-27 DIAGNOSIS — J449 Chronic obstructive pulmonary disease, unspecified: Secondary | ICD-10-CM

## 2018-07-01 NOTE — Telephone Encounter (Signed)
LMTCB

## 2018-07-15 DIAGNOSIS — I10 Essential (primary) hypertension: Secondary | ICD-10-CM | POA: Diagnosis not present

## 2018-07-15 DIAGNOSIS — E038 Other specified hypothyroidism: Secondary | ICD-10-CM | POA: Diagnosis not present

## 2018-07-15 DIAGNOSIS — K210 Gastro-esophageal reflux disease with esophagitis: Secondary | ICD-10-CM | POA: Diagnosis not present

## 2018-07-15 DIAGNOSIS — Z0000 Encounter for general adult medical examination without abnormal findings: Secondary | ICD-10-CM | POA: Diagnosis not present

## 2018-07-15 DIAGNOSIS — J449 Chronic obstructive pulmonary disease, unspecified: Secondary | ICD-10-CM | POA: Diagnosis not present

## 2018-09-07 ENCOUNTER — Other Ambulatory Visit: Payer: Self-pay | Admitting: Pediatrics

## 2018-10-17 ENCOUNTER — Other Ambulatory Visit: Payer: Self-pay | Admitting: Pediatrics

## 2018-10-17 DIAGNOSIS — I712 Thoracic aortic aneurysm, without rupture, unspecified: Secondary | ICD-10-CM

## 2018-10-18 DIAGNOSIS — I10 Essential (primary) hypertension: Secondary | ICD-10-CM | POA: Diagnosis not present

## 2018-10-18 DIAGNOSIS — E038 Other specified hypothyroidism: Secondary | ICD-10-CM | POA: Diagnosis not present

## 2018-10-18 DIAGNOSIS — Z0000 Encounter for general adult medical examination without abnormal findings: Secondary | ICD-10-CM | POA: Diagnosis not present

## 2018-10-18 DIAGNOSIS — J449 Chronic obstructive pulmonary disease, unspecified: Secondary | ICD-10-CM | POA: Diagnosis not present

## 2018-10-18 DIAGNOSIS — K210 Gastro-esophageal reflux disease with esophagitis: Secondary | ICD-10-CM | POA: Diagnosis not present

## 2018-10-18 NOTE — Telephone Encounter (Signed)
Last seen 11/18  Dr Evette Doffing  Needs to be seen

## 2018-10-18 NOTE — Telephone Encounter (Signed)
lmtcb to schedule follow up appt 

## 2018-10-27 ENCOUNTER — Other Ambulatory Visit: Payer: Self-pay | Admitting: Pediatrics

## 2018-10-27 DIAGNOSIS — I712 Thoracic aortic aneurysm, without rupture, unspecified: Secondary | ICD-10-CM

## 2018-11-26 ENCOUNTER — Other Ambulatory Visit: Payer: Self-pay | Admitting: Pediatrics

## 2018-11-26 DIAGNOSIS — I712 Thoracic aortic aneurysm, without rupture, unspecified: Secondary | ICD-10-CM

## 2019-01-20 DIAGNOSIS — E038 Other specified hypothyroidism: Secondary | ICD-10-CM | POA: Diagnosis not present

## 2019-01-20 DIAGNOSIS — Z0000 Encounter for general adult medical examination without abnormal findings: Secondary | ICD-10-CM | POA: Diagnosis not present

## 2019-01-20 DIAGNOSIS — K210 Gastro-esophageal reflux disease with esophagitis: Secondary | ICD-10-CM | POA: Diagnosis not present

## 2019-01-20 DIAGNOSIS — J449 Chronic obstructive pulmonary disease, unspecified: Secondary | ICD-10-CM | POA: Diagnosis not present

## 2019-01-20 DIAGNOSIS — I10 Essential (primary) hypertension: Secondary | ICD-10-CM | POA: Diagnosis not present

## 2019-01-24 ENCOUNTER — Other Ambulatory Visit: Payer: Self-pay | Admitting: Pediatrics

## 2019-01-24 DIAGNOSIS — I712 Thoracic aortic aneurysm, without rupture, unspecified: Secondary | ICD-10-CM

## 2019-02-23 ENCOUNTER — Other Ambulatory Visit: Payer: Self-pay | Admitting: Pediatrics

## 2019-02-23 DIAGNOSIS — I712 Thoracic aortic aneurysm, without rupture, unspecified: Secondary | ICD-10-CM

## 2019-06-01 ENCOUNTER — Other Ambulatory Visit: Payer: Self-pay

## 2019-06-01 NOTE — Patient Outreach (Signed)
[facility]) Care Management  06/01/2019  Theresa Osborne June 05, 1951 [id number]   Medication Adherence call to Theresa Osborne Telephone call to Patient regarding Medication Adherence unable to reach patient. Theresa Osborne is showing past due on Atorvastatin 80 mg under [place].   [place] Management Direct Dial [phone]  Fax [phone] [email or web address]

## 2019-06-06 DIAGNOSIS — I10 Essential (primary) hypertension: Secondary | ICD-10-CM | POA: Diagnosis not present

## 2019-06-06 DIAGNOSIS — N182 Chronic kidney disease, stage 2 (mild): Secondary | ICD-10-CM | POA: Diagnosis not present

## 2019-06-06 DIAGNOSIS — J449 Chronic obstructive pulmonary disease, unspecified: Secondary | ICD-10-CM | POA: Diagnosis not present

## 2019-06-06 DIAGNOSIS — Z1389 Encounter for screening for other disorder: Secondary | ICD-10-CM | POA: Diagnosis not present

## 2019-06-06 DIAGNOSIS — R7303 Prediabetes: Secondary | ICD-10-CM | POA: Diagnosis not present

## 2019-06-06 DIAGNOSIS — E038 Other specified hypothyroidism: Secondary | ICD-10-CM | POA: Diagnosis not present

## 2019-06-06 DIAGNOSIS — K210 Gastro-esophageal reflux disease with esophagitis: Secondary | ICD-10-CM | POA: Diagnosis not present

## 2019-06-06 DIAGNOSIS — Z0000 Encounter for general adult medical examination without abnormal findings: Secondary | ICD-10-CM | POA: Diagnosis not present

## 2019-06-24 DIAGNOSIS — E119 Type 2 diabetes mellitus without complications: Secondary | ICD-10-CM | POA: Diagnosis not present

## 2019-09-06 DIAGNOSIS — J449 Chronic obstructive pulmonary disease, unspecified: Secondary | ICD-10-CM | POA: Diagnosis not present

## 2019-09-06 DIAGNOSIS — I10 Essential (primary) hypertension: Secondary | ICD-10-CM | POA: Diagnosis not present

## 2019-09-06 DIAGNOSIS — K219 Gastro-esophageal reflux disease without esophagitis: Secondary | ICD-10-CM | POA: Diagnosis not present

## 2019-09-06 DIAGNOSIS — E038 Other specified hypothyroidism: Secondary | ICD-10-CM | POA: Diagnosis not present

## 2019-11-18 DIAGNOSIS — R05 Cough: Secondary | ICD-10-CM | POA: Diagnosis not present

## 2019-11-18 DIAGNOSIS — R918 Other nonspecific abnormal finding of lung field: Secondary | ICD-10-CM | POA: Diagnosis not present

## 2019-11-18 DIAGNOSIS — J441 Chronic obstructive pulmonary disease with (acute) exacerbation: Secondary | ICD-10-CM | POA: Diagnosis not present

## 2019-11-18 DIAGNOSIS — S2231XA Fracture of one rib, right side, initial encounter for closed fracture: Secondary | ICD-10-CM | POA: Diagnosis not present

## 2019-11-18 DIAGNOSIS — R0789 Other chest pain: Secondary | ICD-10-CM | POA: Diagnosis not present

## 2019-12-08 DIAGNOSIS — J449 Chronic obstructive pulmonary disease, unspecified: Secondary | ICD-10-CM | POA: Diagnosis not present

## 2019-12-08 DIAGNOSIS — N182 Chronic kidney disease, stage 2 (mild): Secondary | ICD-10-CM | POA: Diagnosis not present

## 2019-12-08 DIAGNOSIS — E038 Other specified hypothyroidism: Secondary | ICD-10-CM | POA: Diagnosis not present

## 2019-12-08 DIAGNOSIS — I10 Essential (primary) hypertension: Secondary | ICD-10-CM | POA: Diagnosis not present

## 2019-12-08 DIAGNOSIS — K2100 Gastro-esophageal reflux disease with esophagitis, without bleeding: Secondary | ICD-10-CM | POA: Diagnosis not present

## 2019-12-08 DIAGNOSIS — R7303 Prediabetes: Secondary | ICD-10-CM | POA: Diagnosis not present

## 2019-12-08 DIAGNOSIS — Z0000 Encounter for general adult medical examination without abnormal findings: Secondary | ICD-10-CM | POA: Diagnosis not present

## 2019-12-29 DIAGNOSIS — H00011 Hordeolum externum right upper eyelid: Secondary | ICD-10-CM | POA: Diagnosis not present

## 2020-01-04 DIAGNOSIS — J449 Chronic obstructive pulmonary disease, unspecified: Secondary | ICD-10-CM | POA: Diagnosis not present

## 2020-02-25 DEATH — deceased
